# Patient Record
Sex: Female | Born: 1965 | Hispanic: Yes | Marital: Married | Smoking: Former smoker
Health system: Southern US, Community
[De-identification: ages and names within clinical notes are randomized; demographics above are authoritative.]

## PROBLEM LIST (undated history)

## (undated) ENCOUNTER — Emergency Department (HOSPITAL_COMMUNITY): Admission: EM | Payer: BC Managed Care – PPO

## (undated) DIAGNOSIS — D649 Anemia, unspecified: Secondary | ICD-10-CM

## (undated) DIAGNOSIS — E7800 Pure hypercholesterolemia, unspecified: Secondary | ICD-10-CM

## (undated) HISTORY — DX: Pure hypercholesterolemia, unspecified: E78.00

## (undated) HISTORY — DX: Anemia, unspecified: D64.9

---

## 2020-01-09 ENCOUNTER — Other Ambulatory Visit: Payer: Self-pay

## 2020-01-09 DIAGNOSIS — Z20822 Contact with and (suspected) exposure to covid-19: Secondary | ICD-10-CM

## 2020-01-10 LAB — NOVEL CORONAVIRUS, NAA: SARS-CoV-2, NAA: NOT DETECTED

## 2020-03-02 ENCOUNTER — Encounter: Payer: Self-pay | Admitting: Family Medicine

## 2020-03-02 ENCOUNTER — Other Ambulatory Visit: Payer: Self-pay

## 2020-03-02 ENCOUNTER — Ambulatory Visit (INDEPENDENT_AMBULATORY_CARE_PROVIDER_SITE_OTHER): Payer: BC Managed Care – PPO | Admitting: Family Medicine

## 2020-03-02 VITALS — BP 112/78 | HR 76 | Ht 66.0 in | Wt 201.0 lb

## 2020-03-02 DIAGNOSIS — Z7689 Persons encountering health services in other specified circumstances: Secondary | ICD-10-CM | POA: Diagnosis not present

## 2020-03-02 DIAGNOSIS — Z01419 Encounter for gynecological examination (general) (routine) without abnormal findings: Secondary | ICD-10-CM | POA: Diagnosis not present

## 2020-03-02 NOTE — Addendum Note (Signed)
Addended by: Duanne Limerick on: 03/02/2020 04:48 PM   Modules accepted: Level of Service

## 2020-03-02 NOTE — Progress Notes (Signed)
Date:  03/02/2020   Name:  Caitlin Cross   DOB:  [birth year]   MRN:  [id number]   Chief Complaint: Establish Care (needs pcp) and referral gyn  Patient is a 54 year old female who presents for a establish care exam. The patient reports the following problems: none. Health maintenance has been reviewed gyn for pap.   No results found for: CREATININE, BUN, NA, K, CL, CO2 No results found for: CHOL, HDL, LDLCALC, LDLDIRECT, TRIG, CHOLHDL No results found for: TSH No results found for: HGBA1C No results found for: WBC, HGB, HCT, MCV, PLT No results found for: ALT, AST, GGT, ALKPHOS, BILITOT   Review of Systems  Constitutional: Negative.  Negative for chills, fatigue, fever and unexpected weight change.  HENT: Negative for congestion, ear discharge, ear pain, rhinorrhea, sinus pressure, sneezing and sore throat.   Eyes: Negative for photophobia, pain, discharge, redness and itching.  Respiratory: Negative for cough, shortness of breath, wheezing and stridor.   Gastrointestinal: Negative for abdominal pain, blood in stool, constipation, diarrhea, nausea and vomiting.  Endocrine: Negative for cold intolerance, heat intolerance, polydipsia, polyphagia and polyuria.  Genitourinary: Negative for dysuria, flank pain, frequency, hematuria, menstrual problem, pelvic pain, urgency, vaginal bleeding and vaginal discharge.  Musculoskeletal: Negative for arthralgias, back pain and myalgias.  Skin: Negative for rash.  Allergic/Immunologic: Negative for environmental allergies and food allergies.  Neurological: Negative for dizziness, weakness, light-headedness, numbness and headaches.  Hematological: Negative for adenopathy. Does not bruise/bleed easily.  Psychiatric/Behavioral: Negative for dysphoric mood. The patient is not nervous/anxious.     There are no problems to display for this patient.   No Known Allergies  Past Surgical History:  Procedure Laterality Date  . CESAREAN SECTION       x 2    Social History   Tobacco Use  . Smoking status: Former Games developer  . Smokeless tobacco: Never Used  Substance Use Topics  . Alcohol use: Yes    Comment: social  . Drug use: Never     Medication list has been reviewed and updated.  No outpatient medications have been marked as taking for the 03/02/20 encounter (Office Visit) with Duanne Limerick, MD.    [facility] 2/9 Scores 03/02/2020  PHQ - 2 Score 0  PHQ- 9 Score 0    BP Readings from Last 3 Encounters:  03/02/20 112/78    Physical Exam Vitals and nursing note reviewed.  Constitutional:      Appearance: She is well-developed.  HENT:     Head: Normocephalic.     Right Ear: Tympanic membrane, ear canal and external ear normal. There is no impacted cerumen.     Left Ear: Tympanic membrane, ear canal and external ear normal. There is no impacted cerumen.     Nose: Nose normal.  Eyes:     General: Lids are everted, no foreign bodies appreciated. No scleral icterus.       Left eye: No foreign body or hordeolum.     Conjunctiva/sclera: Conjunctivae normal.     Right eye: Right conjunctiva is not injected.     Left eye: Left conjunctiva is not injected.     Pupils: Pupils are equal, round, and reactive to light.  Neck:     Thyroid: No thyromegaly.     Vascular: No JVD.     Trachea: No tracheal deviation.  Cardiovascular:     Rate and Rhythm: Normal rate and regular rhythm.     Heart sounds: Normal heart sounds. No  murmur. No friction rub. No gallop.   Pulmonary:     Effort: Pulmonary effort is normal. No respiratory distress.     Breath sounds: Normal breath sounds. No wheezing or rales.  Abdominal:     General: Bowel sounds are normal.     Palpations: Abdomen is soft. There is no mass.     Tenderness: There is no abdominal tenderness. There is no guarding or rebound.  Musculoskeletal:        General: No tenderness. Normal range of motion.     Cervical back: Normal range of motion and neck supple.   Lymphadenopathy:     Cervical: No cervical adenopathy.  Skin:    General: Skin is warm.     Findings: No rash.  Neurological:     Mental Status: She is alert and oriented to person, place, and time.     Cranial Nerves: No cranial nerve deficit.     Deep Tendon Reflexes: Reflexes normal.  Psychiatric:        Mood and Affect: Mood is not anxious or depressed.     Wt Readings from Last 3 Encounters:  03/02/20 201 lb (91.2 kg)    BP 112/78   Pulse 76   Ht 5\' 6"  (1.676 m)   Wt 201 lb (91.2 kg)   BMI 32.44 kg/m   Assessment and Plan:  1. Establishing care with new doctor, encounter for [facility] establishing care with new physician.  Patient would like to have her lipid panel and glucose checked as well as her GFR noted as well.  We will check a lipid panel in renal function panel. - Lipid Panel With LDL/HDL Ratio - Renal Function Panel  2. Visit for gynecologic examination Will refer for gynecology for establishing care with gynecology and cervical cancer Pap smears. - Ambulatory referral to Gynecology

## 2020-03-03 ENCOUNTER — Telehealth: Payer: Self-pay | Admitting: Obstetrics & Gynecology

## 2020-03-03 LAB — LIPID PANEL WITH LDL/HDL RATIO
Cholesterol, Total: 244 mg/dL — ABNORMAL HIGH (ref 100–199)
HDL: 53 mg/dL (ref >39)
LDL Chol Calc (NIH): 164 mg/dL — ABNORMAL HIGH (ref 0–99)
LDL/HDL Ratio: 3.1 ratio (ref 0.0–3.2)
Triglycerides: 148 mg/dL (ref 0–149)
VLDL Cholesterol Cal: 27 mg/dL (ref 5–40)

## 2020-03-03 LAB — RENAL FUNCTION PANEL
Albumin: 4.6 g/dL (ref 3.8–4.9)
BUN/Creatinine Ratio: 21 (ref 9–23)
BUN: 14 mg/dL (ref 6–24)
CO2: 28 mmol/L (ref 20–29)
Calcium: 10.2 mg/dL (ref 8.7–10.2)
Chloride: 102 mmol/L (ref 96–106)
Creatinine, Ser: 0.66 mg/dL (ref 0.57–1.00)
GFR calc Af Amer: 116 mL/min/{1.73_m2} (ref >59)
GFR calc non Af Amer: 100 mL/min/{1.73_m2} (ref >59)
Glucose: 88 mg/dL (ref 65–99)
Phosphorus: 3.9 mg/dL (ref 3.0–4.3)
Potassium: 4.8 mmol/L (ref 3.5–5.2)
Sodium: 142 mmol/L (ref 134–144)

## 2020-03-03 NOTE — Telephone Encounter (Signed)
[facility] referring for Visit for gynecologic examination. Called and left voicemail for patient to call back to be scheduled.

## 2020-03-06 ENCOUNTER — Other Ambulatory Visit: Payer: Self-pay | Admitting: Internal Medicine

## 2020-03-22 ENCOUNTER — Ambulatory Visit (INDEPENDENT_AMBULATORY_CARE_PROVIDER_SITE_OTHER): Payer: BC Managed Care – PPO | Admitting: Advanced Practice Midwife

## 2020-03-22 ENCOUNTER — Encounter: Payer: Self-pay | Admitting: Advanced Practice Midwife

## 2020-03-22 ENCOUNTER — Other Ambulatory Visit (HOSPITAL_COMMUNITY)
Admission: RE | Admit: 2020-03-22 | Discharge: 2020-03-22 | Disposition: A | Discharge status: Home / Self Care | Payer: BC Managed Care – PPO | Source: Ambulatory Visit | Attending: Advanced Practice Midwife | Admitting: Advanced Practice Midwife

## 2020-03-22 ENCOUNTER — Other Ambulatory Visit: Payer: Self-pay

## 2020-03-22 VITALS — BP 127/66 | Ht 66.0 in | Wt 201.0 lb

## 2020-03-22 DIAGNOSIS — Z113 Encounter for screening for infections with a predominantly sexual mode of transmission: Secondary | ICD-10-CM

## 2020-03-22 DIAGNOSIS — Z124 Encounter for screening for malignant neoplasm of cervix: Secondary | ICD-10-CM

## 2020-03-22 DIAGNOSIS — Z01419 Encounter for gynecological examination (general) (routine) without abnormal findings: Secondary | ICD-10-CM | POA: Insufficient documentation

## 2020-03-22 LAB — HM PAP SMEAR: HM Pap smear: NEGATIVE

## 2020-03-22 NOTE — Patient Instructions (Signed)
Mantenimiento de la salud en las mujeres Health Maintenance, Female Adoptar un estilo de vida saludable y recibir atencin preventiva son importantes para promover la salud y el bienestar. Consulte al mdico sobre:  El esquema adecuado para hacerse pruebas y exmenes peridicos.  Cosas que puede hacer por su cuenta para prevenir enfermedades y mantenerse sana. Qu debo saber sobre la dieta, el peso y el ejercicio? Consuma una dieta saludable   Consuma una dieta que incluya muchas verduras, frutas, productos lcteos con bajo contenido de grasa y protenas magras.  No consuma muchos alimentos ricos en grasas slidas, azcares agregados o sodio. Mantenga un peso saludable El ndice de masa muscular (IMC) se utiliza para identificar problemas de peso. Proporciona una estimacin de la grasa corporal basndose en el peso y la altura. Su mdico puede ayudarle a determinar su IMC y a lograr o mantener un peso saludable. Haga ejercicio con regularidad Haga ejercicio con regularidad. Esta es una de las prcticas ms importantes que puede hacer por su salud. La mayora de los adultos deben seguir estas pautas:  Realizar, al menos, 150minutos de actividad fsica por semana. El ejercicio debe aumentar la frecuencia cardaca y hacerlo transpirar (ejercicio de intensidad moderada).  Hacer ejercicios de fortalecimiento por lo menos dos veces por semana. Agregue esto a su plan de ejercicio de intensidad moderada.  Pasar menos tiempo sentados. Incluso la actividad fsica ligera puede ser beneficiosa. Controle sus niveles de colesterol y lpidos en la sangre Comience a realizarse anlisis de lpidos y colesterol en la sangre a los 20aos y luego reptalos cada 5aos. Hgase controlar los niveles de colesterol con mayor frecuencia si:  Sus niveles de lpidos y colesterol son altos.  Es mayor de 40aos.  Presenta un alto riesgo de padecer enfermedades cardacas. Qu debo saber sobre las pruebas de  deteccin del cncer? Segn su historia clnica y sus antecedentes familiares, es posible que deba realizarse pruebas de deteccin del cncer en diferentes edades. Esto puede incluir pruebas de deteccin de lo siguiente:  Cncer de mama.  Cncer de cuello uterino.  Cncer colorrectal.  Cncer de piel.  Cncer de pulmn. Qu debo saber sobre la enfermedad cardaca, la diabetes y la hipertensin arterial? Presin arterial y enfermedad cardaca  La hipertensin arterial causa enfermedades cardacas y aumenta el riesgo de accidente cerebrovascular. Es ms probable que esto se manifieste en las personas que tienen lecturas de presin arterial alta, tienen ascendencia africana o tienen sobrepeso.  Hgase controlar la presin arterial: ? Cada 3 a 5 aos si tiene entre 18 y 39 aos. ? Todos los aos si es mayor de 40aos. Diabetes Realcese exmenes de deteccin de la diabetes con regularidad. Este anlisis revisa el nivel de azcar en la sangre en ayunas. Hgase las pruebas de deteccin:  Cada tresaos despus de los 40aos de edad si tiene un peso normal y un bajo riesgo de padecer diabetes.  Con ms frecuencia y a partir de una edad inferior si tiene sobrepeso o un alto riesgo de padecer diabetes. Qu debo saber sobre la prevencin de infecciones? Hepatitis B Si tiene un riesgo ms alto de contraer hepatitis B, debe someterse a un examen de deteccin de este virus. Hable con el mdico para averiguar si tiene riesgo de contraer la infeccin por hepatitis B. Hepatitis C Se recomienda el anlisis a:  Todos los que nacieron entre 1945 y 1965.  Todas las personas que tengan un riesgo de haber contrado hepatitis C. Enfermedades de transmisin sexual (ETS)  Hgase las   pruebas de deteccin de ITS, incluidas la gonorrea y la clamidia, si: ? Es sexualmente activa y es menor de 24aos. ? Es mayor de 24aos, y el mdico le informa que corre riesgo de tener este tipo de  infecciones. ? La actividad sexual ha cambiado desde que le hicieron la ltima prueba de deteccin y tiene un riesgo mayor de tener clamidia o gonorrea. Pregntele al mdico si usted tiene riesgo.  Pregntele al mdico si usted tiene un alto riesgo de contraer VIH. El mdico tambin puede recomendarle un medicamento recetado para ayudar a evitar la infeccin por el VIH. Si elige tomar medicamentos para prevenir el VIH, primero debe hacerse los anlisis de deteccin del VIH. Luego debe hacerse anlisis cada 3meses mientras est tomando los medicamentos. Embarazo  Si est por dejar de menstruar (fase premenopusica) y usted puede quedar embarazada, busque asesoramiento antes de quedar embarazada.  Tome de 400 a 800microgramos (mcg) de cido flico todos los das si queda embarazada.  Pida mtodos de control de la natalidad (anticonceptivos) si desea evitar un embarazo no deseado. Osteoporosis y menopausia La osteoporosis es una enfermedad en la que los huesos pierden los minerales y la fuerza por el avance de la edad. El resultado pueden ser fracturas en los huesos. Si tiene 65aos o ms, o si est en riesgo de sufrir osteoporosis y fracturas, pregunte a su mdico si debe:  Hacerse pruebas de deteccin de prdida sea.  Tomar un suplemento de calcio o de vitamina D para reducir el riesgo de fracturas.  Recibir terapia de reemplazo hormonal (TRH) para tratar los sntomas de la menopausia. Siga estas instrucciones en su casa: Estilo de vida  No consuma ningn producto que contenga nicotina o tabaco, como cigarrillos, cigarrillos electrnicos y tabaco de mascar. Si necesita ayuda para dejar de fumar, consulte al mdico.  No consuma drogas.  No comparta agujas.  Solicite ayuda a su mdico si necesita apoyo o informacin para abandonar las drogas. Consumo de alcohol  No beba alcohol si: ? Su mdico le indica no hacerlo. ? Est embarazada, puede estar embarazada o est tratando de quedar  embarazada.  Si bebe alcohol: ? Limite la cantidad que consume de 0 a 1 medida por da. ? Limite la ingesta si est amamantando.  Est atento a la cantidad de alcohol que hay en las bebidas que toma. En los [place], una medida equivale a una botella de cerveza de 12oz (355ml), un vaso de vino de 5oz (148ml) o un vaso de una bebida alcohlica de alta graduacin de 1oz (44ml). Instrucciones generales  Realcese los estudios de rutina de la salud, dentales y de la vista.  Mantngase al da con las vacunas.  Infrmele a su mdico si: ? Se siente deprimida con frecuencia. ? Alguna vez ha sido vctima de maltrato o no se siente segura en su casa. Resumen  Adoptar un estilo de vida saludable y recibir atencin preventiva son importantes para promover la salud y el bienestar.  Siga las instrucciones del mdico acerca de una dieta saludable, el ejercicio y la realizacin de pruebas o exmenes para detectar enfermedades.  Siga las instrucciones del mdico con respecto al control del colesterol y la presin arterial. Esta informacin no tiene como fin reemplazar el consejo del mdico. Asegrese de hacerle al mdico cualquier pregunta que tenga. Document Revised: 11/18/2018 Document Reviewed: 11/18/2018 Elsevier Patient Education  2020 Elsevier Inc.  

## 2020-03-22 NOTE — Progress Notes (Signed)
Gynecology Annual Exam  Date of Service: 03/22/2020  Interpreter present assisting with interview  The patient was referred by her new PCP for PAP smear. She was 10 minutes late for her appointment and was focused on post-menopausal symptoms. Due to length of time spent on patient's concerns, we were unable to address mammogram or colonoscopy.    PCP: Duanne Limerick, MD  Chief Complaint:  Chief Complaint  Patient presents with  . Gynecologic Exam    Vasomotor symptoms    History of Present Illness:Patient is a 54 y.o. G4P4 presents for annual exam. The patient has post-menopausal complaints today. She has a hot flash during the night about 3-4 times per week which is mildly disruptive due to the sweating associated with it. She denies interrupted sleep. She wonders what non-hormonal treatments are available. She also mentions that she would like to have more energy during the day and wonders what she can take for that. We discussed healthy lifestyle, caffeine in moderate doses as a relatively safe stimulant, and alternative treatments for vaso-constrictive events. She would like to start with non-pharmaceutical options.   LMP: No LMP recorded. Patient is post-menopausal  Postcoital Bleeding: no Dysmenorrhea: not applicable  The patient is sexually active. She denies dyspareunia.  The patient does perform self breast exams.  There is no notable family history of breast or ovarian cancer in her family.  The patient wears seatbelts: yes.   The patient has regular exercise: she walks several days per week. she admits a generally healthy diet and adequate hydration and adequate sleep.    The patient denies current symptoms of depression.     Review of Systems: Review of Systems  Constitutional: Negative for chills and fever.       Positive for decreased energy  HENT: Negative for congestion, ear discharge, ear pain, hearing loss, sinus pain and sore throat.   Eyes: Negative for  blurred vision and double vision.  Respiratory: Negative for cough, shortness of breath and wheezing.   Cardiovascular: Negative for chest pain, palpitations and leg swelling.  Gastrointestinal: Negative for abdominal pain, blood in stool, constipation, diarrhea, heartburn, melena, nausea and vomiting.  Genitourinary: Negative for dysuria, flank pain, frequency, hematuria and urgency.  Musculoskeletal: Negative for back pain, joint pain and myalgias.  Skin: Negative for itching and rash.  Neurological: Negative for dizziness, tingling, tremors, sensory change, speech change, focal weakness, seizures, loss of consciousness, weakness and headaches.  Endo/Heme/Allergies: Negative for environmental allergies. Does not bruise/bleed easily.       Positive for hot flashes  Psychiatric/Behavioral: Negative for depression, hallucinations, memory loss, substance abuse and suicidal ideas. The patient is not nervous/anxious and does not have insomnia.     Past Medical History:  There are no problems to display for this patient.   Past Surgical History:  Past Surgical History:  Procedure Laterality Date  . CESAREAN SECTION     x 2    Gynecologic History:  No LMP recorded. Last Pap: 2 years ago in [place] Results were:  no abnormalities per patient Last mammogram: unknown Results were: reports calcifications 10 years ago  Obstetric History: G68P4  Family History:  Family History  Problem Relation Age of Onset  . Diabetes Mother   . Heart disease Father   . Stroke Father   . Diabetes Sister   . Diabetes Brother   . Diabetes Maternal Grandmother     Social History:  Social History   Socioeconomic History  . Marital  status: Married    Spouse name: Not on file  . Number of children: Not on file  . Years of education: Not on file  . Highest education level: Not on file  Occupational History  . Not on file  Tobacco Use  . Smoking status: Former Research scientist (life sciences)  . Smokeless tobacco: Never  Used  Substance and Sexual Activity  . Alcohol use: Yes    Comment: social  . Drug use: Never  . Sexual activity: Yes    Birth control/protection: None  Other Topics Concern  . Not on file  Social History Narrative  . Not on file   Social Determinants of Health   Financial Resource Strain:   . Difficulty of Paying Living Expenses:   Food Insecurity:   . Worried About Charity fundraiser in the Last Year:   . Arboriculturist in the Last Year:   Transportation Needs:   . Film/video editor (Medical):   Marland Kitchen Lack of Transportation (Non-Medical):   Physical Activity:   . Days of Exercise per Week:   . Minutes of Exercise per Session:   Stress:   . Feeling of Stress :   Social Connections:   . Frequency of Communication with Friends and Family:   . Frequency of Social Gatherings with Friends and Family:   . Attends Religious Services:   . Active Member of Clubs or Organizations:   . Attends Archivist Meetings:   Marland Kitchen Marital Status:   Intimate Partner Violence:   . Fear of Current or Ex-Partner:   . Emotionally Abused:   Marland Kitchen Physically Abused:   . Sexually Abused:     Allergies:  No Known Allergies  Medications: Prior to Admission medications   Medication Sig Start Date End Date Taking? Authorizing Provider  Multiple Vitamins-Minerals (WOMENS MULTIVITAMIN PO) Take by mouth.   Yes [provider]  Omega-3 Fatty Acids (FISH OIL) 1000 MG CAPS Take by mouth.   Yes [provider]    Physical Exam Vitals: Blood pressure 127/66, height 5\' 6"  (1.676 m), weight 201 lb (91.2 kg).  General: NAD HEENT: normocephalic, anicteric Thyroid: no enlargement, no palpable nodules Pulmonary: No increased work of breathing, CTAB Cardiovascular: RRR, distal pulses 2+ Breast: Breast symmetrical, no tenderness, no palpable nodules or masses, no skin or nipple retraction present, no nipple discharge.  No axillary or supraclavicular lymphadenopathy. Abdomen: NABS,  soft, non-tender, non-distended.  Umbilicus without lesions.  No hepatomegaly, splenomegaly or masses palpable. No evidence of hernia  Genitourinary:  External: Normal external female genitalia.  Normal urethral meatus, normal Bartholin's and Skene's glands.    Vagina: Normal vaginal mucosa, no evidence of prolapse.    Cervix:  2 Skin tags vs polyps vs unrepaired laceration present approximately 1 cm x 1 cm each, no bleeding, no CMT  Uterus: Non-enlarged, mobile, normal contour.    Adnexa: ovaries non-enlarged, no adnexal masses  Rectal: deferred  Lymphatic: no evidence of inguinal lymphadenopathy Extremities: no edema, erythema, or tenderness Neurologic: Grossly intact Psychiatric: mood appropriate, affect full     Assessment: 54 y.o. G4P4 routine annual exam  Plan: Problem List Items Addressed This Visit    None    Visit Diagnoses    Well woman exam with routine gynecological exam    -  Primary   Relevant Orders   HIV Antibody (routine testing w rflx)   RPR Qual   Hepatitis B surface antigen   Cytology - PAP   Screen for sexually transmitted  diseases       Relevant Orders   HIV Antibody (routine testing w rflx)   RPR Qual   Hepatitis B surface antigen   Cytology - PAP   Cervical cancer screening       Relevant Orders   Cytology - PAP      1) Mammogram - recommend yearly screening mammogram.  Mammogram to be ordered by PCP   2) STI screening for both serum and vaginal swab organisms  was offered and accepted. Patient unable to go to [facility] today for blood draw. Lab requisitions handed to patient so she can go when her schedule permits.  3) ASCCP guidelines and rationale discussed.  Patient opts for every 3 years screening interval. PAP done today as we don't have records of prior PAP. Will follow up with patient as needed for recommendations regarding abnormal appearing cervix.  4) Osteoporosis  - per USPTF routine screening DEXA at age 41  Consider FDA-approved  medical therapies in postmenopausal women and men aged 61 years and older, based on the following: a) A hip or vertebral (clinical or morphometric) fracture b) T-score ? -2.5 at the femoral neck or spine after appropriate evaluation to exclude secondary causes C) Low bone mass (T-score between -1.0 and -2.5 at the femoral neck or spine) and a 10-year probability of a hip fracture ? 3% or a 10-year probability of a major osteoporosis-related fracture ? 20% based on the [place]-adapted WHO algorithm   5) Routine healthcare maintenance including cholesterol, diabetes screening discussed managed by PCP  6) Colonoscopy: to be managed by PCP.  Screening recommended starting at age 24 for average risk individuals, age 7 for individuals deemed at increased risk (including African Americans) and recommended to continue until age 58.  For patient age 71-85 individualized approach is recommended.  Gold standard screening is via colonoscopy, Cologuard screening is an acceptable alternative for patient unwilling or unable to undergo colonoscopy.  "Colorectal cancer screening for average?risk adults: 2018 guideline update from the American Cancer Society"CA: A Cancer Journal for Clinicians: Apr 09, 2017   7) Increase healthy lifestyle: exercise. OTC supplements for post-menopausal hot flashes: Omega 3 FA, Vitamin D3 1000 units daily, black cohosh, phytoestrogens- soy, grains, other beans, fruits, vegetables. Dress in layers. Fan. Stay hydrated.  7) Return in about 1 year (around 03/22/2021) for annual established gyn.    Parke Poisson, CNM [facility] 03/23/20, 11:38 AM

## 2020-03-23 ENCOUNTER — Encounter: Payer: Self-pay | Admitting: Advanced Practice Midwife

## 2020-03-24 LAB — CYTOLOGY - PAP
Chlamydia: NEGATIVE
Comment: NEGATIVE
Comment: NEGATIVE
Comment: NEGATIVE
Comment: NORMAL
Diagnosis: NEGATIVE
High risk HPV: NEGATIVE
Neisseria Gonorrhea: NEGATIVE
Trichomonas: NEGATIVE

## 2020-03-28 ENCOUNTER — Other Ambulatory Visit: Payer: Self-pay

## 2020-03-28 NOTE — Progress Notes (Unsigned)
Entered pap 

## 2020-11-29 ENCOUNTER — Other Ambulatory Visit: Payer: Self-pay

## 2020-11-29 ENCOUNTER — Ambulatory Visit
Admission: EM | Admit: 2020-11-29 | Discharge: 2020-11-29 | Disposition: A | Discharge status: Home / Self Care | Payer: BC Managed Care – PPO | Attending: Emergency Medicine | Admitting: Emergency Medicine

## 2020-11-29 ENCOUNTER — Ambulatory Visit
Admission: EM | Admit: 2020-11-29 | Discharge: 2020-11-29 | Disposition: A | Discharge status: Home / Self Care | Payer: BC Managed Care – PPO

## 2020-11-29 DIAGNOSIS — Z20822 Contact with and (suspected) exposure to covid-19: Secondary | ICD-10-CM | POA: Diagnosis not present

## 2020-11-29 NOTE — ED Notes (Signed)
No answer when called for room 

## 2020-11-29 NOTE — ED Triage Notes (Signed)
Pt presents with exposure to covid, daughter is covid positive. Reports fever, fatigue, and flue like symptoms. Pt denies any other symptoms. Symptoms started yesterday.

## 2020-11-29 NOTE — Discharge Instructions (Addendum)
Late at home into the results of your COVID test come back.  If you are positive then you will need to quarantine for 5 days from when your symptoms started.  After the 5 days you can break quarantine if your symptoms have improved and you have not had a fever for 24 hours.  Use over-the-counter Tylenol and ibuprofen as needed for fever or pain.  If you develop shortness of breath-especially at rest, you were unable to speak full sentences, or as a late sign your lips started turning blue you need to go to the ER for evaluation.

## 2020-11-29 NOTE — ED Provider Notes (Signed)
[facility]    CSN: [id number] Arrival date & time: 11/29/20  1311      History   Chief Complaint Chief Complaint  Patient presents with  . Fever    HPI Caitlin Cross is a 55 y.o. female.   HPI   55 year old female here for evaluation of fever and fatigue that is been going on for the past 2 days.  Patient reports that she was exposed to COVID through her daughter.  Apparently her daughter just had a cesarean section and came home from the hospital today.  Patient states that her maximum temperature was 99 and she has had associated symptoms of sneezing, sore throat, nonproductive cough.  Patient denies runny nose, shortness of breath or wheezing, nausea, or vomiting.  Patient's had both her COVID-vaccine and booster as well as her flu shot.  Past Medical History:  Diagnosis Date  . Anemia     There are no problems to display for this patient.   Past Surgical History:  Procedure Laterality Date  . CESAREAN SECTION     x 2    OB History    Gravida  4   Para  4   Term      Preterm      AB      Living  4     SAB      IAB      Ectopic      Multiple  1   Live Births               Home Medications    Prior to Admission medications   Medication Sig Start Date End Date Taking? Authorizing Provider  Multiple Vitamins-Minerals (WOMENS MULTIVITAMIN PO) Take by mouth.    [provider]  Omega-3 Fatty Acids (FISH OIL) 1000 MG CAPS Take by mouth.    [provider]    Family History Family History  Problem Relation Age of Onset  . Diabetes Mother   . Heart disease Father   . Stroke Father   . Diabetes Sister   . Diabetes Brother   . Diabetes Maternal Grandmother     Social History Social History   Tobacco Use  . Smoking status: Former Games developer  . Smokeless tobacco: Never Used  Vaping Use  . Vaping Use: Never used  Substance Use Topics  . Alcohol use: Yes    Comment: social  . Drug use: Never      Allergies   Patient has no known allergies.   Review of Systems Review of Systems  Constitutional: Positive for diaphoresis and fever. Negative for activity change and appetite change.  HENT: Positive for congestion, sneezing and sore throat. Negative for ear pain and rhinorrhea.   Respiratory: Negative for cough, shortness of breath and wheezing.   Gastrointestinal: Negative for diarrhea, nausea and vomiting.  Musculoskeletal: Negative for arthralgias and myalgias.  Skin: Negative for rash.  Hematological: Negative.      Physical Exam Triage Vital Signs ED Triage Vitals  Enc Vitals Group     BP 11/29/20 1328 118/84     Pulse Rate 11/29/20 1328 72     Resp 11/29/20 1328 19     Temp 11/29/20 1328 98.5 F (36.9 C)     Temp src --      SpO2 --      Weight --      Height --      Head Circumference --      Peak Flow --  Pain Score 11/29/20 1326 0     Pain Loc --      Pain Edu? --      Excl. in GC? --    No data found.  Updated Vital Signs BP 118/84   Pulse 72   Temp 98.5 F (36.9 C)   Resp 19   Visual Acuity Right Eye Distance:   Left Eye Distance:   Bilateral Distance:    Right Eye Near:   Left Eye Near:    Bilateral Near:     Physical Exam Vitals and nursing note reviewed.  Constitutional:      General: She is not in acute distress.    Appearance: Normal appearance. She is not toxic-appearing.  HENT:     Head: Normocephalic and atraumatic.     Right Ear: Tympanic membrane, ear canal and external ear normal.     Left Ear: Tympanic membrane, ear canal and external ear normal.     Nose: Congestion present. No rhinorrhea.     Mouth/Throat:     Mouth: Mucous membranes are moist.     Pharynx: Oropharynx is clear. No oropharyngeal exudate or posterior oropharyngeal erythema.  Cardiovascular:     Rate and Rhythm: Normal rate.     Pulses: Normal pulses.     Heart sounds: No murmur heard. No gallop.   Pulmonary:     Effort: Pulmonary effort is  normal.     Breath sounds: Normal breath sounds. No wheezing, rhonchi or rales.  Musculoskeletal:     Cervical back: Normal range of motion and neck supple.  Lymphadenopathy:     Cervical: No cervical adenopathy.  Skin:    General: Skin is warm and dry.     Capillary Refill: Capillary refill takes less than 2 seconds.     Findings: No erythema or rash.  Neurological:     General: No focal deficit present.     Mental Status: She is alert and oriented to person, place, and time.  Psychiatric:        Mood and Affect: Mood normal.        Behavior: Behavior normal.        Thought Content: Thought content normal.        Judgment: Judgment normal.      UC Treatments / Results  Labs (all labs ordered are listed, but only abnormal results are displayed) Labs Reviewed  SARS CORONAVIRUS 2 (TAT 6-24 HRS)    EKG   Radiology No results found.  Procedures Procedures (including critical care time)  Medications Ordered in UC Medications - No data to display  Initial Impression / Assessment and Plan / UC Course  I have reviewed the triage vital signs and the nursing notes.  Pertinent labs & imaging results that were available during my care of the patient were reviewed by me and considered in my medical decision making (see chart for details).   Patient is here for evaluation of possible COVID secondary to exposure from her daughter who was just released from the hospital after cesarean section.  Patient's physical exam only reveals some nasal congestion without nasal discharge.  The remainder of her exam is benign.  Will swab patient for COVID and have her isolate at home pending the results.  If she is positive then she will have to quarantine for 5 days from the start of her symptoms. Final Clinical Impressions(s) / UC Diagnoses   Final diagnoses:  Exposure to confirmed case of COVID-19     Discharge  Instructions     Late at home into the results of your COVID test come back.   If you are positive then you will need to quarantine for 5 days from when your symptoms started.  After the 5 days you can break quarantine if your symptoms have improved and you have not had a fever for 24 hours.  Use over-the-counter Tylenol and ibuprofen as needed for fever or pain.  If you develop shortness of breath-especially at rest, you were unable to speak full sentences, or as a late sign your lips started turning blue you need to go to the ER for evaluation.    ED Prescriptions    None     PDMP not reviewed this encounter.   Becky Augusta, NP 11/29/20 1441

## 2020-11-30 LAB — SARS CORONAVIRUS 2 (TAT 6-24 HRS): SARS Coronavirus 2: NEGATIVE

## 2020-12-04 ENCOUNTER — Other Ambulatory Visit: Payer: BC Managed Care – PPO

## 2020-12-06 ENCOUNTER — Other Ambulatory Visit: Payer: BC Managed Care – PPO

## 2020-12-06 DIAGNOSIS — Z20822 Contact with and (suspected) exposure to covid-19: Secondary | ICD-10-CM

## 2020-12-08 LAB — NOVEL CORONAVIRUS, NAA: SARS-CoV-2, NAA: NOT DETECTED

## 2020-12-08 LAB — SARS-COV-2, NAA 2 DAY TAT

## 2021-01-07 ENCOUNTER — Emergency Department: Payer: BC Managed Care – PPO

## 2021-01-07 ENCOUNTER — Other Ambulatory Visit: Payer: Self-pay

## 2021-01-07 ENCOUNTER — Emergency Department
Admission: EM | Admit: 2021-01-07 | Discharge: 2021-01-07 | Disposition: A | Discharge status: Home / Self Care | Payer: BC Managed Care – PPO | Attending: Emergency Medicine | Admitting: Emergency Medicine

## 2021-01-07 DIAGNOSIS — Z87891 Personal history of nicotine dependence: Secondary | ICD-10-CM | POA: Diagnosis not present

## 2021-01-07 DIAGNOSIS — Y999 Unspecified external cause status: Secondary | ICD-10-CM | POA: Insufficient documentation

## 2021-01-07 DIAGNOSIS — M542 Cervicalgia: Secondary | ICD-10-CM

## 2021-01-07 DIAGNOSIS — Y92410 Unspecified street and highway as the place of occurrence of the external cause: Secondary | ICD-10-CM | POA: Insufficient documentation

## 2021-01-07 DIAGNOSIS — Y9389 Activity, other specified: Secondary | ICD-10-CM | POA: Diagnosis not present

## 2021-01-07 DIAGNOSIS — M545 Low back pain, unspecified: Secondary | ICD-10-CM | POA: Diagnosis not present

## 2021-01-07 DIAGNOSIS — H9313 Tinnitus, bilateral: Secondary | ICD-10-CM | POA: Diagnosis not present

## 2021-01-07 MED ORDER — MELOXICAM 7.5 MG PO TABS
15.0000 mg | ORAL_TABLET | Freq: Once | ORAL | Status: AC
  Administered 2021-01-07: 15 mg via ORAL
  Filled 2021-01-07: qty 2

## 2021-01-07 MED ORDER — ACETAMINOPHEN 325 MG PO TABS
650.0000 mg | ORAL_TABLET | Freq: Once | ORAL | Status: AC
  Administered 2021-01-07: 650 mg via ORAL
  Filled 2021-01-07: qty 2

## 2021-01-07 MED ORDER — METHOCARBAMOL 500 MG PO TABS
750.0000 mg | ORAL_TABLET | Freq: Once | ORAL | Status: AC
  Administered 2021-01-07: 750 mg via ORAL
  Filled 2021-01-07: qty 2

## 2021-01-07 MED ORDER — MELOXICAM 15 MG PO TABS: 15.0000 mg | ORAL_TABLET | Freq: Every day | ORAL | 0 refills | Status: AC

## 2021-01-07 MED ORDER — METHOCARBAMOL 750 MG PO TABS: 750.0000 mg | ORAL_TABLET | Freq: Four times a day (QID) | ORAL | 0 refills | Status: AC | PRN
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
## 2021-01-07 NOTE — Discharge Instructions (Addendum)
Take anti-inflammatory and muscle relaxant as prescribed.  Follow-up with primary care if not improved in 1 to 2 weeks.
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
## 2021-01-07 NOTE — ED Notes (Signed)
Patient back to room from imaging.

## 2021-04-13 ENCOUNTER — Telehealth: Payer: Self-pay

## 2021-04-13 NOTE — Telephone Encounter (Signed)
Pt scheduled for cpe

## 2021-04-13 NOTE — Telephone Encounter (Signed)
We have not seen this patient since 2021. Please call the patient to schedule a CPE with Dr Yetta Barre. We need to update her health maintenance.   Thank you!

## 2021-04-18 ENCOUNTER — Ambulatory Visit
Admission: RE | Admit: 2021-04-18 | Discharge: 2021-04-18 | Disposition: A | Discharge status: Home / Self Care | Payer: BC Managed Care – PPO | Attending: Family Medicine | Admitting: Family Medicine

## 2021-04-18 ENCOUNTER — Ambulatory Visit (INDEPENDENT_AMBULATORY_CARE_PROVIDER_SITE_OTHER): Payer: BC Managed Care – PPO | Admitting: Family Medicine

## 2021-04-18 ENCOUNTER — Encounter: Payer: Self-pay | Admitting: Family Medicine

## 2021-04-18 ENCOUNTER — Ambulatory Visit
Admission: RE | Admit: 2021-04-18 | Discharge: 2021-04-18 | Disposition: A | Discharge status: Home / Self Care | Payer: BC Managed Care – PPO | Source: Ambulatory Visit | Attending: Family Medicine | Admitting: Family Medicine

## 2021-04-18 ENCOUNTER — Other Ambulatory Visit: Payer: Self-pay

## 2021-04-18 VITALS — BP 122/62 | HR 64 | Ht 66.0 in | Wt 205.0 lb

## 2021-04-18 DIAGNOSIS — M25562 Pain in left knee: Secondary | ICD-10-CM | POA: Diagnosis not present

## 2021-04-18 DIAGNOSIS — G8929 Other chronic pain: Secondary | ICD-10-CM

## 2021-04-18 DIAGNOSIS — Z1211 Encounter for screening for malignant neoplasm of colon: Secondary | ICD-10-CM | POA: Diagnosis not present

## 2021-04-18 DIAGNOSIS — Z0000 Encounter for general adult medical examination without abnormal findings: Secondary | ICD-10-CM

## 2021-04-18 DIAGNOSIS — Z6833 Body mass index (BMI) 33.0-33.9, adult: Secondary | ICD-10-CM | POA: Diagnosis not present

## 2021-04-18 MED ORDER — MELOXICAM 15 MG PO TABS: 15.0000 mg | ORAL_TABLET | Freq: Every day | ORAL | 0 refills | Status: DC

## 2021-04-18 NOTE — Patient Instructions (Signed)
Recuento de calorías para bajar de peso  Calorie Counting for Weight Loss  Las calorías son unidades de energía. El cuerpo necesita una cierta cantidad de calorías de los alimentos para que lo ayuden a funcionar durante todo el día. Cuando se comen o beben más calorías de las que el cuerpo necesita, este acumula las calorías adicionales mayormente como grasa. Cuando se comen o beben menos calorías de las que el cuerpo necesita, este quema grasa para obtener la energía que necesita.  El recuento de calorías es el registro de la cantidad de calorías que se comen y beben cada día. El recuento de calorías puede ser de ayuda si necesita perder peso. Si come menos calorías de las que el cuerpo necesita, debería bajar de peso. Pregúntele al médico cuál es un peso sano para usted.  Para que el recuento de calorías funcione, usted tendrá que ingerir la cantidad de calorías adecuadas cada día, para bajar una cantidad de peso saludable por semana. Un nutricionista puede ayudar a determinar la cantidad de calorías que usted necesita por día y sugerirle formas de alcanzar su objetivo calórico.  · Una cantidad de peso saludable para bajar cada semana suele ser entre 1 y 2 libras (0.5 a 0.9 kg). Esto habitualmente significa que su ingesta diaria de calorías se debería reducir en unas 500 a 750 calorías.  · Ingerir de 1200 a 1500 calorías por día puede ayudar a la mayoría de las mujeres a bajar de peso.  · Ingerir de 1500 a 1800 calorías por día puede ayudar a la mayoría de los hombres a bajar de peso.  ¿Qué debo saber acerca del recuento de calorías?  Trabaje con el médico o el nutricionista para determinar cuántas calorías debe recibir cada día. A fin de alcanzar su objetivo diario de calorías, tendrá que:  · Averiguar cuántas calorías hay en cada alimento que le gustaría comer. Intente hacerlo antes de comer.  · Decidir la cantidad que puede comer del alimento.  · Llevar un registro de los alimentos. Para esto, anote lo que  comió y cuántas calorías tenía.  Para perder peso con éxito, es importante equilibrar el recuento de calorías con un estilo de vida saludable que incluya actividad física de forma regular.  ¿Dónde encuentro información sobre las calorías?    Es posible encontrar la cantidad de calorías que contiene un alimento en la etiqueta de información nutricional. Si un alimento no tiene una etiqueta de información nutricional, intente buscar las calorías en Internet o pida ayuda al nutricionista.  Recuerde que las calorías se calculan por porción. Si opta por comer más de una porción de un alimento, tendrá que multiplicar las calorías de una porción por la cantidad de porciones que planea comer. Por ejemplo, la etiqueta de un envase de pan puede decir que el tamaño de una porción es 1 rodaja, y que una porción tiene 90 calorías. Si come 1 rodaja, habrá comido 90 calorías. Si come 2 rodajas, habrá comido 180 calorías.  ¿Cómo llevo un registro de comidas?  Después de cada vez que coma, anote lo siguiente en el registro de alimentos lo antes posible:  · Lo que comió. Asegúrese de incluir los aderezos, las salsas y otros extras en los alimentos.  · La cantidad que comió. Esto se puede medir en tazas, onzas o cantidad de alimentos.  · Cuántas calorías había en cada alimento y en cada bebida.  · La cantidad total de calorías en la comida que tomó.  Tenga a mano el registro de alimentos, por ejemplo, en un anotador de bolsillo o   utilice una aplicación o sitio web en el teléfono móvil. Algunos programas calcularán las calorías por usted y le mostrarán la cantidad de calorías que le quedan para llegar al objetivo diario.  ¿Cuáles son algunos consejos para controlar las porciones?  · Sepa cuántas calorías hay en una porción. Esto lo ayudará a saber cuántas porciones de un alimento determinado puede comer.  · Use una taza medidora para medir los tamaños de las porciones. También puede intentar pesar las porciones en una balanza de  cocina. Con el tiempo, podrá hacer un cálculo estimativo de los tamaños de las porciones de algunos alimentos.  · Dedique tiempo a poner porciones de diferentes alimentos en sus platos, tazones y tazas predilectos, a fin de saber cómo se ve una porción.  · Intente no comer directamente de un envase de alimentos, por ejemplo, de una bolsa o una caja. Comer directamente del envase dificulta ver cuánto está comiendo y puede conducir a comer en exceso. Ponga la cantidad que le gustaría comer en una taza o un plato, a fin de asegurarse de que está comiendo la porción correcta.  · Use platos, vasos y tazones más pequeños para medir porciones más pequeñas y evitar no comer en exceso.  · Intente no realizar varias tareas al mismo tiempo. Por ejemplo, evite mirar televisión o usar la computadora mientras come. Si es la hora de comer, siéntese a la mesa y disfrute de la comida. Esto lo ayudará a reconocer cuándo está satisfecho. También le permitirá estar más consciente de qué come y cuánto come.  Consejos para seguir este plan  Al leer las etiquetas de los alimentos  · Controle el recuento de calorías en comparación con el tamaño de la porción. El tamaño de la porción puede ser más pequeño de lo que suele comer.  · Verifique la fuente de las calorías. Intente elegir alimentos ricos en proteínas, fibras y vitaminas, y bajos en grasas saturadas, grasas trans y sodio.  Al ir de compras  · Lea las etiquetas nutricionales cuando compre. Esto lo ayudará a tomar decisiones saludables sobre qué alimentos comprar.  · Preste atención a las etiquetas nutricionales de alimentos bajos en grasas o sin grasas. Estos alimentos a veces tienen la misma cantidad de calorías o más calorías que las versiones ricas en grasas. Con frecuencia, también tienen agregados de azúcar, almidón o sal, para darles el sabor que fue eliminado con las grasas.  · Haga una lista de compras con los alimentos que tienen un menor contenido de calorías y  respétela.  Al cocinar  · Intente cocinar sus alimentos preferidos de una manera más saludable. Por ejemplo, pruebe hornear en vez de freír.  · Utilice productos lácteos descremados.  Planificación de las comidas  · Utilice más frutas y verduras. La mitad de su plato debe ser de frutas y verduras.  · Incluya proteínas magras, como pollo, pavo y pescado.  Estilo de vida  Cada semana, trate de hacer una de las siguientes cosas:  · 150 minutos de ejercicio moderado, como caminar.  · 75 minutos de ejercicio enérgico, como correr.  Información general  · Sepa cuántas calorías tienen los alimentos que come con más frecuencia. Esto le ayudará a contar las calorías más rápidamente.  · Encuentre un método para controlar las calorías que funcione para usted. Sea creativo. Pruebe aplicaciones o programas distintos, si llevar un registro de las calorías no funciona para usted.  ¿Qué alimentos debo consumir?    · Consuma alimentos nutritivos. Es mejor comer un alimento   nutritivo, de alto contenido calórico, como un aguacate, que uno con pocos nutrientes, como una bolsa de patatas fritas.  · Use sus calorías en alimentos y bebidas que lo sacien y no lo dejen con apetito apenas termina de comer.  ? Ejemplos de alimentos que lo sacian son los frutos secos y mantequillas de frutos secos, verduras, proteínas magras y alimentos con alto contenido de fibra como los cereales integrales. Los alimentos con alto contenido de fibra son aquellos que tienen más de 5 g de fibra por porción.  · Preste atención a las calorías en las bebidas. Las bebidas de bajas calorías incluyen agua y refrescos sin azúcar.  Es posible que los productos que se enumeran más arriba no constituyan una lista completa de los alimentos y las bebidas que puede tomar. Consulte a un nutricionista para obtener más información.  ¿Qué alimentos debo limitar?  Limite el consumo de alimentos o bebidas que no sean buenas fuentes de vitaminas, minerales o proteínas, o que  tengan alto contenido de grasas no saludables. Estos incluyen:  · Caramelos.  · Otros dulces.  · Refrescos, bebidas con café especiales, alcohol y jugo.  Es posible que los productos que se enumeran más arriba no constituyan una lista completa de los alimentos y las bebidas que debe evitar. Consulte a un nutricionista para obtener más información.  ¿Cómo puedo hacer el recuento de calorías cuando como afuera?  · Preste atención a las porciones. A menudo, las porciones son mucho más grandes al comer afuera. Pruebe con estos consejos para mantener las porciones más pequeñas:  ? Considere la posibilidad de compartir una comida en lugar de tomarla toda usted solo.  ? Si pide su propia comida, coma solo la mitad. Antes de empezar a comer, pida un recipiente y ponga la mitad de la comida en él.  ? Cuando sea posible, considere la posibilidad de pedir porciones más pequeñas del menú en lugar de porciones completas.  · Preste atención a la elección de alimentos y bebidas. Saber la forma en que se cocinan los alimentos y lo que incluye la comida puede ayudarlo a ingerir menos calorías.  ? Si se detallan las calorías en el menú, elija las opciones que contengan la menor cantidad.  ? Elija platos que incluyan verduras, frutas, cereales integrales, productos lácteos con bajo contenido de grasa y proteínas magras.  ? Opte por los alimentos hervidos, asados, cocidos a la parrilla o al vapor. Evite los alimentos a los que se les ponga mantequilla, que estén empanados o fritos, o que se sirvan con salsa a base de crema. Generalmente, los alimentos que se etiquetan como “crujientes” están fritos, a menos que se indique lo contrario.  ? Elija el agua, la leche descremada, el té helado sin azúcar u otras bebidas que no contengan azúcares agregados. Si desea una bebida alcohólica, escoja una opción con menos calorías, como una copa de vino o una cerveza ligera.  ? Ordene los aderezos, las salsas y los jarabes aparte. Estos son, con  frecuencia, de alto contenido en calorías, por lo que debe limitar la cantidad que ingiere.  ? Si desea una ensalada, elija una de hortalizas y pida carnes a la parrilla. Evite las guarniciones adicionales como el tocino, el queso o los alimentos fritos. Ordene el aderezo aparte o pida aceite de oliva y vinagre o limón para aderezar.  · Haga un cálculo estimativo de la cantidad de porciones que le sirven. Conocer el tamaño de las porciones lo ayudará a estar atento a   la cantidad de comida que come en los restaurantes.  Dónde buscar más información  · Centers for Disease Control and Prevention (Centros para el Control y la Prevención de Enfermedades): www.cdc.gov  · [place] Department of Agriculture (Departamento de Agricultura de los [place]): myplate.gov  Resumen  · El recuento de calorías es el registro de la cantidad de calorías que se comen y beben cada día. Si come menos calorías de las que el cuerpo necesita, debería bajar de peso.  · Una cantidad de peso saludable para bajar por semana suele ser entre 1 y 2 libras (0.5 a 0.9 kg). Esto significa, con frecuencia, reducir su ingesta diaria de calorías unas 500 a 750 calorías.  · Es posible encontrar la cantidad de calorías que contiene un alimento en la etiqueta de información nutricional. Si un alimento no tiene una etiqueta de información nutricional, intente buscar las calorías en Internet o pida ayuda al nutricionista.  · Use platos, vasos y tazones más pequeños para medir porciones más pequeñas y evitar no comer en exceso.  · Use sus calorías en alimentos y bebidas que lo sacien y no lo dejen con apetito poco tiempo después de haber comido.  Esta información no tiene como fin reemplazar el consejo del médico. Asegúrese de hacerle al médico cualquier pregunta que tenga.  Document Revised: 02/21/2020 Document Reviewed: 02/21/2020  Elsevier Patient Education © 2021 [employer]

## 2021-04-18 NOTE — Progress Notes (Signed)
Date:  04/18/2021   Name:  Caitlin Cross   DOB:  29-Dec-1965   MRN:  [id number]   Chief Complaint: Knee Pain (L) knee pain and swelling), referral colon cancer, and Labs Only (Lipid renal)  Knee Pain  Incident onset: chronic pain years/10 years. There was no injury mechanism. The pain is at a severity of 10/10. The pain is severe. Associated symptoms include an inability to bear weight. The symptoms are aggravated by movement. She has tried NSAIDs and acetaminophen for the symptoms. The treatment provided mild relief.    Lab Results  Component Value Date   CREATININE 0.66 03/02/2020   BUN 14 03/02/2020   NA 142 03/02/2020   K 4.8 03/02/2020   CL 102 03/02/2020   CO2 28 03/02/2020   Lab Results  Component Value Date   CHOL 244 (H) 03/02/2020   HDL 53 03/02/2020   LDLCALC 164 (H) 03/02/2020   TRIG 148 03/02/2020   No results found for: TSH No results found for: HGBA1C No results found for: WBC, HGB, HCT, MCV, PLT No results found for: ALT, AST, GGT, ALKPHOS, BILITOT   Review of Systems  Constitutional: Negative for chills and fever.  HENT: Negative for drooling, ear discharge, ear pain and sore throat.   Respiratory: Negative for cough, shortness of breath and wheezing.   Cardiovascular: Negative for chest pain, palpitations and leg swelling.  Gastrointestinal: Negative for abdominal pain, blood in stool, constipation, diarrhea and nausea.  Endocrine: Negative for polydipsia.  Genitourinary: Negative for dysuria, frequency, hematuria and urgency.  Musculoskeletal: Negative for back pain, myalgias and neck pain.  Skin: Negative for rash.  Allergic/Immunologic: Negative for environmental allergies.  Neurological: Negative for dizziness and headaches.  Hematological: Does not bruise/bleed easily.  Psychiatric/Behavioral: Negative for suicidal ideas. The patient is not nervous/anxious.     There are no problems to display for this patient.   No Known Allergies  Past  Surgical History:  Procedure Laterality Date  . CESAREAN SECTION     x 2    Social History   Tobacco Use  . Smoking status: Former Games developer  . Smokeless tobacco: Never Used  Vaping Use  . Vaping Use: Never used  Substance Use Topics  . Alcohol use: Yes    Comment: social  . Drug use: Never     Medication list has been reviewed and updated.  Current Meds  Medication Sig  . Multiple Vitamins-Minerals (WOMENS MULTIVITAMIN PO) Take by mouth.  . Omega-3 Fatty Acids (FISH OIL) 1000 MG CAPS Take by mouth.    PHQ 2/9 Scores 04/18/2021 03/02/2020  PHQ - 2 Score 0 0  PHQ- 9 Score 0 0    GAD 7 : Generalized Anxiety Score 04/18/2021 03/02/2020  Nervous, Anxious, on Edge 0 0  Control/stop worrying 0 0  Worry too much - different things 0 0  Trouble relaxing 0 0  Restless 0 0  Easily annoyed or irritable 0 0  Afraid - awful might happen 0 0  Total GAD 7 Score 0 0    BP Readings from Last 3 Encounters:  04/18/21 122/62  01/07/21 140/87  11/29/20 118/84    Physical Exam Vitals and nursing note reviewed.  Constitutional:      Appearance: She is well-developed.  HENT:     Head: Normocephalic.     Right Ear: External ear normal.     Left Ear: External ear normal.  Eyes:     General: Lids are everted, no foreign bodies  appreciated. No scleral icterus.       Left eye: No foreign body or hordeolum.     Conjunctiva/sclera: Conjunctivae normal.     Right eye: Right conjunctiva is not injected.     Left eye: Left conjunctiva is not injected.     Pupils: Pupils are equal, round, and reactive to light.  Neck:     Thyroid: No thyromegaly.     Vascular: No JVD.     Trachea: No tracheal deviation.  Cardiovascular:     Rate and Rhythm: Normal rate and regular rhythm.     Heart sounds: Normal heart sounds. No murmur heard. No friction rub. No gallop.   Pulmonary:     Effort: Pulmonary effort is normal. No respiratory distress.     Breath sounds: Normal breath sounds. No wheezing or  rales.  Abdominal:     General: Bowel sounds are normal.     Palpations: Abdomen is soft. There is no mass.     Tenderness: There is no abdominal tenderness. There is no guarding or rebound.  Musculoskeletal:        General: Normal range of motion.     Cervical back: Normal range of motion and neck supple.     Left knee: No effusion or crepitus. Tenderness present over the medial joint line. No lateral joint line, MCL, LCL, ACL or patellar tendon tenderness. No LCL laxity, MCL laxity or ACL laxity.Normal meniscus and normal patellar mobility.     Instability Tests: Anterior drawer test negative.  Lymphadenopathy:     Cervical: No cervical adenopathy.  Skin:    General: Skin is warm.     Findings: No rash.  Neurological:     Mental Status: She is alert and oriented to person, place, and time.     Cranial Nerves: No cranial nerve deficit.     Deep Tendon Reflexes: Reflexes normal.  Psychiatric:        Mood and Affect: Mood is not anxious or depressed.     Wt Readings from Last 3 Encounters:  04/18/21 205 lb (93 kg)  01/07/21 202 lb (91.6 kg)  03/22/20 201 lb (91.2 kg)    BP 122/62   Pulse 64   Ht 5\' 6"  (1.676 m)   Wt 205 lb (93 kg)   BMI 33.09 kg/m   Assessment and Plan: 1. Chronic pain of left knee Chronic.  Knee pain for 10 years which she is treated with NSAIDs and glucosamine with osteochondral ointment.  Exam is consistent with possible meniscal concern versus osteoarthritis.  We will initiate meloxicam 15 mg once a day.  We will obtain a complete knee of the knee.  And we will refer to Dr. sports medicine for evaluation in the near future. - DG Knee Complete 4 Views Left; Future - meloxicam (MOBIC) 15 MG tablet; Take 1 tablet (15 mg total) by mouth daily.  Dispense: 30 tablet; Refill: 0  2. BMI 33.0-33.9,adult Noted weight and patient has been given diet to lose weight which will help her we will obtain a lipid panel and renal function panel to determine  status of current labs. - Lipid Panel With LDL/HDL Ratio - Renal Function Panel  3. Healthcare maintenance Reviewed health maintenance and she is followed by GYN and she is up-to-date on her Paps.  And mammograms.  4. Colon cancer screening Patient desires colon cancer screening and we have made referrals to gastroenterology to proceed in that direction. - Ambulatory referral to Gastroenterology

## 2021-04-19 LAB — RENAL FUNCTION PANEL
Albumin: 4.5 g/dL (ref 3.8–4.9)
BUN/Creatinine Ratio: 21 (ref 9–23)
BUN: 14 mg/dL (ref 6–24)
CO2: 28 mmol/L (ref 20–29)
Calcium: 9.5 mg/dL (ref 8.7–10.2)
Chloride: 103 mmol/L (ref 96–106)
Creatinine, Ser: 0.67 mg/dL (ref 0.57–1.00)
Glucose: 98 mg/dL (ref 65–99)
Phosphorus: 3.5 mg/dL (ref 3.0–4.3)
Potassium: 4.3 mmol/L (ref 3.5–5.2)
Sodium: 143 mmol/L (ref 134–144)
eGFR: 103 mL/min/{1.73_m2} (ref >59)

## 2021-04-19 LAB — LIPID PANEL WITH LDL/HDL RATIO
Cholesterol, Total: 323 mg/dL — ABNORMAL HIGH (ref 100–199)
HDL: 55 mg/dL (ref >39)
LDL Chol Calc (NIH): 248 mg/dL — ABNORMAL HIGH (ref 0–99)
LDL/HDL Ratio: 4.5 ratio — ABNORMAL HIGH (ref 0.0–3.2)
Triglycerides: 112 mg/dL (ref 0–149)
VLDL Cholesterol Cal: 20 mg/dL (ref 5–40)

## 2021-04-20 ENCOUNTER — Other Ambulatory Visit: Payer: Self-pay

## 2021-04-20 DIAGNOSIS — E782 Mixed hyperlipidemia: Secondary | ICD-10-CM

## 2021-04-20 MED ORDER — ROSUVASTATIN CALCIUM 10 MG PO TABS: 10.0000 mg | ORAL_TABLET | Freq: Every day | ORAL | 1 refills | Status: DC

## 2021-04-20 NOTE — Progress Notes (Signed)
Sent in Crestor 10mg 

## 2021-04-23 ENCOUNTER — Encounter: Payer: Self-pay | Admitting: *Deleted

## 2021-04-30 ENCOUNTER — Ambulatory Visit (INDEPENDENT_AMBULATORY_CARE_PROVIDER_SITE_OTHER): Payer: BC Managed Care – PPO | Admitting: Family Medicine

## 2021-04-30 ENCOUNTER — Inpatient Hospital Stay (INDEPENDENT_AMBULATORY_CARE_PROVIDER_SITE_OTHER): Payer: BC Managed Care – PPO | Admitting: Radiology

## 2021-04-30 ENCOUNTER — Other Ambulatory Visit: Payer: Self-pay

## 2021-04-30 ENCOUNTER — Ambulatory Visit: Payer: Self-pay

## 2021-04-30 ENCOUNTER — Encounter: Payer: Self-pay | Admitting: Family Medicine

## 2021-04-30 ENCOUNTER — Inpatient Hospital Stay: Payer: Self-pay | Admitting: Radiology

## 2021-04-30 VITALS — BP 110/72 | HR 72 | Temp 98.2°F | Ht 66.0 in | Wt 205.0 lb

## 2021-04-30 DIAGNOSIS — M1712 Unilateral primary osteoarthritis, left knee: Secondary | ICD-10-CM

## 2021-04-30 MED ORDER — TRIAMCINOLONE ACETONIDE 40 MG/ML IJ SUSP
40.0000 mg | Freq: Once | INTRAMUSCULAR | Status: AC
  Administered 2021-04-30: 40 mg

## 2021-04-30 NOTE — Addendum Note (Signed)
Addended by: Monica Becton on: 04/30/2021 05:14 PM   Modules accepted: Orders

## 2021-04-30 NOTE — Assessment & Plan Note (Signed)
Patient with longstanding (years) history of atraumatic left knee pain primary localized to the anteromedial knee with recent flare.  She is in a physically demanding job.  She does have intermittent episodes of swelling, not currently.  Pain primarily with ascending/descending stairs, prolonged time on feet.  Treatments have included rest, ice, and recent meloxicam which has provided benefit though she has noted some mild GI upset.  Her x-rays today confirm severe medial and moderate patellofemoral osteoarthritis.  Her physical exam findings are consistent with maximal pain at the medial joint line, secondarily to the inferomedial patellar facet, no ligamentous laxity appreciated.  No effusion noted.  We reviewed various strategies moving forward and she is amenable to ultrasound-guided corticosteroid injection which we performed in the office today.  Postinjection care outlined.  He is to continue meloxicam on an as-needed basis until symptoms respond to cortisone.  I have provided home exercises for her to begin and we will reach out to obtain coverage information for viscosupplementation.  She is to return once scheduled.

## 2021-04-30 NOTE — Progress Notes (Signed)
Primary Care / Sports Medicine Office Visit  Patient Information:  Patient ID: Caitlin Cross, female DOB: 1966-10-24 Age: 55 y.o. MRN: [id number]   Caitlin Cross is a pleasant 55 y.o. female presenting with the following:  Chief Complaint  Patient presents with   New Patient (Initial Visit)    Interpreter Services: [employer]#[id number]   Knee Pain    Left; chronic x10 years; left knee X-Ray 04/18/21 shows osteoarthritis; taking meloxicam 15 mg daily with some relief; 3/10 pain in office    Review of Systems pertinent details above   Patient Active Problem List   Diagnosis Date Noted   Primary osteoarthritis of left knee 04/30/2021   Past Medical History:  Diagnosis Date   Anemia    Outpatient Encounter Medications as of 04/30/2021  Medication Sig   Ascorbic Acid (VITAMIN C) 1000 MG tablet Take 2,000 mg by mouth daily.   meloxicam (MOBIC) 15 MG tablet Take 1 tablet (15 mg total) by mouth daily.   Multiple Vitamins-Minerals (WOMENS MULTIVITAMIN PO) Take 1 tablet by mouth daily.   Omega-3 Fatty Acids (FISH OIL) 1000 MG CAPS Take 1 capsule by mouth daily.   rosuvastatin (CRESTOR) 10 MG tablet Take 1 tablet (10 mg total) by mouth daily.   No facility-administered encounter medications on file as of 04/30/2021.   Past Surgical History:  Procedure Laterality Date   CESAREAN SECTION     x 2    Vitals:   04/30/21 1446  BP: 110/72  Pulse: 72  Temp: 98.2 F (36.8 C)  SpO2: 98%   Vitals:   04/30/21 1446  Weight: 205 lb (93 kg)  Height: 5\' 6"  (1.676 m)   Body mass index is 33.09 kg/m.  DG Knee Complete 4 Views Left  Result Date: 04/20/2021 CLINICAL DATA:  Chronic knee pain over the last 10 years. EXAM: LEFT KNEE - COMPLETE 4+ VIEW COMPARISON:  None. FINDINGS: Small amount of joint fluid. Medial compartment joint space narrowing with marginal osteophytes consistent with ordinary osteoarthritis. Lateral compartment and patellofemoral compartment appear normal. No other  focal finding. IMPRESSION: Medial weight-bearing compartment osteoarthritis. Small amount of joint fluid. Electronically Signed   By: 06/20/2021 M.D.   On: 04/20/2021 13:57     Independent interpretation of notes and tests performed by another provider:   Independent interpretation of left knee x-ray dated 04/20/2021 reveals severe medial tibiofemoral joint space narrowing, subchondral sclerosis, moderate patellar facet osteophyte formation with associated joint space narrowing noted, no acute osseous process identified  Procedures performed:   Procedure:  Injection of left knee anteromedial joint under ultrasound guidance. Ultrasound guidance utilized for needle placement, confirmation of joint line, sonographic response of injectate noted Samsung HS60 device utilized with permanent recording / reporting. Consent obtained and verified. Skin prepped in a sterile fashion. Ethyl chloride spray for topical local analgesia.  Completed without difficulty and tolerated well. Medication: triamcinolone acetonide 40 mg/mL suspension for injection 1 mL total and 2 mL lidocaine 1% without epinephrine utilized for needle placement anesthetic Advised to contact for fevers/chills, erythema, induration, drainage, or persistent bleeding.   Pertinent History, Exam, Impression, and Recommendations:   Primary osteoarthritis of left knee Patient with longstanding (years) history of atraumatic left knee pain primary localized to the anteromedial knee with recent flare.  She is in a physically demanding job.  She does have intermittent episodes of swelling, not currently.  Pain primarily with ascending/descending stairs, prolonged time on feet.  Treatments have included rest, ice, and recent meloxicam  which has provided benefit though she has noted some mild GI upset.  Her x-rays today confirm severe medial and moderate patellofemoral osteoarthritis.  Her physical exam findings are consistent with maximal pain at  the medial joint line, secondarily to the inferomedial patellar facet, no ligamentous laxity appreciated.  No effusion noted.  We reviewed various strategies moving forward and she is amenable to ultrasound-guided corticosteroid injection which we performed in the office today.  Postinjection care outlined.  He is to continue meloxicam on an as-needed basis until symptoms respond to cortisone.  I have provided home exercises for her to begin and we will reach out to obtain coverage information for viscosupplementation.  She is to return once scheduled.    Orders & Medications No orders of the defined types were placed in this encounter.  Orders Placed This Encounter  Procedures   [place] LIMITED JOINT SPACE STRUCTURES LOW LEFT   [place] LIMITED JOINT SPACE STRUCTURES LOW LEFT   [place] LIMITED JOINT SPACE STRUCTURES LOW LEFT     Return We will contact patient to schedule.     Caitlin Banana, MD   Primary Care Sports Medicine [facility]

## 2021-04-30 NOTE — Addendum Note (Signed)
Addended by: Lennart Pall on: 04/30/2021 04:23 PM   Modules accepted: Orders

## 2021-04-30 NOTE — Patient Instructions (Signed)
You have just been given a cortisone injection to reduce pain and inflammation. After the injection you may notice immediate relief of pain as a result of the Lidocaine. It is important to rest the area of the injection for 24 to 48 hours after the injection. There is a possibility of some temporary increased discomfort and swelling for up to 72 hours until the cortisone begins to work. If you do have pain, simply rest the joint and use ice. If you can tolerate over the counter medications, you can try Tylenol, Aleve, or Advil for added relief per package instructions. - Our office will contact you in regards to gel injection scheduling and follow-up - Utilize knee brace for prolonged time on, remove for bedtime/bathing - Start home exercises in 1 week with information provided - Continue meloxicam on an as-needed basis until symptoms respond to cortisone

## 2021-05-01 NOTE — Addendum Note (Signed)
Addended by: Annita Brod on: 05/01/2021 11:27 AM   Modules accepted: Orders

## 2021-05-04 ENCOUNTER — Telehealth: Payer: Self-pay

## 2021-05-04 DIAGNOSIS — M1712 Unilateral primary osteoarthritis, left knee: Secondary | ICD-10-CM

## 2021-05-04 NOTE — Telephone Encounter (Signed)
Called patient to notify that her insurance criteria to get approved for Monovisc gel injections is for patient to have failed conservative treatment of Physical Therapy along with acetaminophen and/or NSAIDs. Left message for patient to return call. Physical Therapy referral placed.  For your information.

## 2021-05-07 ENCOUNTER — Ambulatory Visit: Payer: BC Managed Care – PPO | Admitting: Family Medicine

## 2021-05-08 NOTE — Progress Notes (Deleted)
Error

## 2021-05-10 NOTE — Progress Notes (Signed)
error 

## 2021-05-21 ENCOUNTER — Ambulatory Visit: Payer: BC Managed Care – PPO | Attending: Family Medicine

## 2021-05-21 ENCOUNTER — Other Ambulatory Visit: Payer: Self-pay

## 2021-05-21 DIAGNOSIS — M6281 Muscle weakness (generalized): Secondary | ICD-10-CM | POA: Diagnosis present

## 2021-05-21 DIAGNOSIS — M25562 Pain in left knee: Secondary | ICD-10-CM | POA: Insufficient documentation

## 2021-05-21 DIAGNOSIS — G8929 Other chronic pain: Secondary | ICD-10-CM | POA: Insufficient documentation

## 2021-05-21 NOTE — Therapy (Signed)
[facility] [address] [place], [place], [address] Phone: [phone]   Fax:  [phone]  Physical Therapy Evaluation  Patient Details  Name: Caitlin BuntingMary Cross MRN: [id number] Date of Birth: 05-16-1966 Referring Provider (PT): Dr. Ashley RoyaltyMatthews  Encounter Date: 05/21/2021   PT End of Session - 05/23/21 0906     Visit Number 1    Number of Visits 17    Date for PT Re-Evaluation 07/16/21    Authorization Type eval: 05/21/21    PT Start Time 1615    PT Stop Time 1700    PT Time Calculation (min) 45 min    Activity Tolerance Patient tolerated treatment well    Behavior During Therapy [facility] for tasks assessed/performed             Past Medical History:  Diagnosis Date   Anemia     Past Surgical History:  Procedure Laterality Date   CESAREAN SECTION     x 2    There were no vitals filed for this visit.    Subjective Assessment - 05/23/21 0854     Subjective Left knee pain    Pertinent History Pt reports approximately 8 years of left knee pain localized to the anterior medial left knee with intermediate episodes of swelling. She gets occasional redness in the medial part of the left knee. 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 She uses ice and elevation to control her pain. She reports imaging of her L knee in 2019 and states that orthopedic surgery suggested knee surgery however she declined. She is unclear what type of knee surgery they were suggesting. She saw Dr. Ashley RoyaltyMatthews on 04/30/21 and plain film radiographs showed severe medial and moderate patellofemoral osteoarthritis. He prescribed meloxicam which has helped significantly. She does report mild GI upset since starting the medication. She also underwent an ultrasound-guided corticosteroid injection into her L knee and she  reports approximately 40% improvement since the injection. She had never previously had a steroid injection in her L knee. MD is currently seeking coverage information for visco supplementation. Pt states that she has tried glucosamine in the past which has been helpful. She has tried compressions stockings to help with the swelling as well as a knee brace. States she has been screened for systemic causes of pain and denies history of rheumatoid arthritis. Red flags negative.    Limitations Walking;Standing    How long can you sit comfortably? 30 minutes    How long can you stand comfortably? 30 minutes    How long can you walk comfortably? 30 minutes    Diagnostic tests See history    Patient Stated Goals Decrease left knee pain in order to complete her responsibilities at home and work    Currently in Pain? No/denies                [facility] - 05/23/21 0855       Assessment   Medical Diagnosis Primary OA of L knee    Referring Provider (PT) Dr. Ashley RoyaltyMatthews    Onset Date/Surgical Date 05/21/13    Next MD Visit Not reported    Prior Therapy None for this issue      Precautions   Precautions None      Restrictions   Weight Bearing Restrictions No  SUBJECTIVE Onset: Pt reports approximately 8 years of left knee pain localized to the anterior medial left knee with intermediate episodes of swelling. She gets occasional redness in the medial part of the left knee. 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 She uses ice and elevation to control her pain. She reports imaging of her L knee in 2019 and states that orthopedic surgery suggested knee surgery however she declined. She is unclear what type of knee surgery they were suggesting. She saw Dr. Ashley Royalty on 04/30/21 and plain film radiographs showed severe  medial and moderate patellofemoral osteoarthritis. He prescribed meloxicam which has helped significantly. She does report mild GI upset since starting the medication. She also underwent an ultrasound-guided corticosteroid injection into her L knee and she reports approximately 40% improvement since the injection. She had never previously had a steroid injection in her L knee. MD is currently seeking coverage information for visco supplementation. Pt states that she has tried glucosamine in the past which has been helpful. She has tried compressions stockings to help with the swelling as well as a knee brace. States she has been screened for systemic causes of pain and denies history of rheumatoid arthritis. Red flags negative.  Follow-up appt with MD: Pain: 10/10 Present, 10/10 Best, 10/10 Worst: Aggravating factors: twisting on L knee, extended standing, excessive pressure on knee, steps, extensive walking Easing factors: ice, meloxicam; Walking: 30 minutes Standing: 30 minutes; 24 hour pain behavior: Worse at the end of the day after activity.  Knee surgery: No Recent knee trauma: No Prior history of knee injury or pain: No Pain quality: pain quality: burning Radiating symptoms: Yes, down the leg to the foot  Numbness/Tingling: Yes, numbness in the R foot Imaging: Yes, see history    OBJECTIVE  MUSCULOSKELETAL: Tremor: Absent Bulk: Normal Tone: Normal, no spasticity, rigidity, or clonus No trophic changes noted to lower extremities. No ecchymosis, erythema, or edema noted around L knee.   Posture No gross abnormalities noted in standing or seated posture  Gait No gross deficits in gait identified. Pt denies L knee pain during ambulation during evaluation.  Palpation Pain to palpation along medial joint line of knee as well as medial thigh just superior to the knee over the medial epicondyle . No pain over patellar tendon. No pain with palpation to quadriceps or hamstrings. Pain  with patellar compression L knee;  Strength R/L 4+/4 Hip flexion 4-/4- Hip extension  4-/4- Hip abduction 4-/4- Hip adduction 5/4* Knee extension 4-/4- Knee flexion 5/5 Ankle Dorsiflexion *indicates pain   AROM  Lumbar AROM: Lumbar and hip: WFL and painless with overpressure in all planes Hip: [facility]   Hip R/L Flexion: WFL Extension: WFL Abduction: WFL Adduction: WFL Internal Rotation: WFL External Rotation: WFL *indicates pain  L Knee Flexion: 135  Extension: 0 *indicates pain  Muscle Length Hamstring length: 80/80; Quad length [facility]): Mild limitation in quad length bilaterally, no knee pain bilaterally during testing;   Passive Accessory Motion Deferred  NEUROLOGICAL:  Mental Status Patient is oriented to person, place and time.  Recent memory is intact.  Remote memory is intact.  Attention span and concentration are intact.  Expressive speech is intact.  Patient's fund of knowledge is within normal limits for educational level.  Sensation Deferred  Reflexes Deferred  SPECIAL TESTS Deferred  Objective measurements completed on examination: See above findings.                 PT Short Term Goals - 05/23/21 0934       PT SHORT TERM GOAL #1   Title Pt will be independent with HEP in order to decrease knee pain and increase strength in order to improve pain-free function at home and work.    Time 4    Period Weeks    Status New    Target Date 06/18/21               PT Long Term Goals - 05/23/21 0935       PT LONG TERM GOAL #1   Title Pt will decrease worst pain as reported on NPRS by at least 3 points in order to demonstrate clinically significant reduction in ankle/foot pain.    Baseline 05/21/21: 10/10    Time 8    Period Weeks    Status New    Target Date 07/16/21      PT LONG TERM GOAL #2   Title Pt will increase FOTO to at least 72  in order to demonstrate significant improvement in lower  extremity function.    Baseline 05/21/21: 59    Time 8    Period Weeks    Status New    Target Date 07/16/21      PT LONG TERM GOAL #3   Title Pt will increase LEFS by at least 9 points in order to demonstrate significant improvement in lower extremity function.    Baseline 05/21/21: To be completed at next visit    Time 8    Period Weeks    Status New    Target Date 07/16/21      PT LONG TERM GOAL #4   Title Pt will increase strength of L hip extension, abduction, and addution by at least 1/2 MMT grade in order to demonstrate improvement in strength and function as well as improved stability to L knee    Baseline 05/21/21: 4-/5 for all direction    Time 8    Period Weeks    Status New    Target Date 07/16/21                    Plan - 05/23/21 0931     Clinical Impression Statement Pt is a pleasant 55 year-old female referred for L knee osteoarthritis. Plain film radiographs with Dr. Ashley Royalty showed severe medial and moderate patellofemoral osteoarthritis. PT examination reveals full and painless active range of motion with overpressure of lumbar spine, left hip, and left knee. She reports pain to palpation along medial joint line of left knee as well as medial thigh just superior to the knee over the medial epicondyle. No pain over patellar tendon. No pain with palpation to quadriceps or hamstrings.  Positive pain with patellar compression.  Weak and painful left knee extension as well as weakness with bilateral hip extension, abduction, and adduction bilaterally however no pain reported.  No gross deficits or pain during gait today patient reports wearing supportive footwear at work as well as when walking for exercise at home. Pt will benefit from PT services to address deficits in strength and pain in order to return to full function at home and work with less knee pain.    Personal Factors and Comorbidities Age;Past/Current Experience;Time since onset of  injury/illness/exacerbation    Examination-Activity Limitations Locomotion Level;Sit;Stand    Examination-Participation Restrictions  Community Activity;Occupation    Stability/Clinical Decision Making Stable/Uncomplicated    Clinical Decision Making Low    Rehab Potential Good    PT Frequency 2x / week    PT Duration 8 weeks    PT Treatment/Interventions ADLs/Self Care Home Management;Biofeedback;Aquatic Therapy;Canalith Repostioning;Cryotherapy;Electrical Stimulation;Iontophoresis 4mg /ml Dexamethasone;Moist Heat;Traction;Ultrasound;Contrast Bath;DME Instruction;Gait training;Stair training;Functional mobility training;Therapeutic activities;Therapeutic exercise;Balance training;Neuromuscular re-education;Patient/family education;Manual techniques;Passive range of motion;Dry needling;Vestibular;Spinal Manipulations;Joint Manipulations    PT Next Visit Plan Special testing of left knee, have patient complete LEFS, passive accessory testing of knee, initiate HEP    PT Home Exercise Plan None currently    Consulted and Agree with Plan of Care Patient             Patient will benefit from skilled therapeutic intervention in order to improve the following deficits and impairments:  Decreased strength, Pain  Visit Diagnosis: Chronic pain of left knee  Muscle weakness (generalized)     Problem List Patient Active Problem List   Diagnosis Date Noted   Primary osteoarthritis of left knee 04/30/2021   05/02/2021 PT, DPT, GCS  Rahul Malinak 05/23/2021, 9:43 AM  [facility] [address] [place], [place], [place] Phone: [phone]   Fax:  [phone]  Name: Lailanie Hasley MRN: Caitlin Cross Date of Birth: 03-Nov-1966

## 2021-05-28 ENCOUNTER — Ambulatory Visit: Payer: BC Managed Care – PPO

## 2021-05-28 ENCOUNTER — Other Ambulatory Visit: Payer: Self-pay

## 2021-05-28 DIAGNOSIS — M25562 Pain in left knee: Secondary | ICD-10-CM | POA: Diagnosis not present

## 2021-05-28 DIAGNOSIS — M6281 Muscle weakness (generalized): Secondary | ICD-10-CM

## 2021-05-28 DIAGNOSIS — G8929 Other chronic pain: Secondary | ICD-10-CM

## 2021-05-28 NOTE — Patient Instructions (Signed)
Access Code: [email or web address] URL: [email or web address] Date: 05/28/2021 Prepared by: Ria Comment  Exercises Sidelying Hip Abduction (Mirrored) - 1 x daily - 7 x weekly - 2 sets - 10 reps - 3s hold Clamshell - 1 x daily - 7 x weekly - 2 sets - 10 reps - 3s hold Supine Quad Set - 1 x daily - 7 x weekly - 2 sets - 10 reps - 3s hold Active Straight Leg Raise with Quad Set (Mirrored) - 1 x daily - 7 x weekly - 2 sets - 10 reps - 3s hold

## 2021-05-28 NOTE — Therapy (Signed)
[facility] [address] [place], [place], [address] Phone: [phone]   Fax:  [phone]  Physical Therapy Treatment  Patient Details  Name: Caitlin Cross MRN: [id number] Date of Birth: Jun 21, 1966 Referring Provider (PT): Dr. Ashley Royalty   Encounter Date: 05/28/2021   PT End of Session - 05/28/21 1625     Visit Number 2    Number of Visits 17    Date for PT Re-Evaluation 07/16/21    Authorization Type eval: 05/21/21    PT Start Time 1620    PT Stop Time 1700    PT Time Calculation (min) 40 min    Activity Tolerance Patient tolerated treatment well    Behavior During Therapy [facility] for tasks assessed/performed             Past Medical History:  Diagnosis Date   Anemia     Past Surgical History:  Procedure Laterality Date   CESAREAN SECTION     x 2    There were no vitals filed for this visit.   Subjective Assessment - 05/28/21 1624     Subjective Pt reports that she is doing alright today but is tired after working hard today. She reports 4/10 L knee pain upon arrival today. No significant changes in L knee since initial evaluation.    Pertinent History Pt reports approximately 8 years of left knee pain localized to the anterior medial left knee with intermediate episodes of swelling. She gets occasional redness in the medial part of the left knee. 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 She uses ice and elevation to control her pain. She reports imaging of her L knee in 2019 and states that orthopedic surgery suggested knee surgery however she declined. She is unclear what type of knee surgery they were suggesting. She saw Dr. Ashley Royalty on 04/30/21 and plain film radiographs showed severe medial and moderate patellofemoral osteoarthritis. He prescribed meloxicam which has  helped significantly. She does report mild GI upset since starting the medication. She also underwent an ultrasound-guided corticosteroid injection into her L knee and she reports approximately 40% improvement since the injection. She had never previously had a steroid injection in her L knee. MD is currently seeking coverage information for visco supplementation. Pt states that she has tried glucosamine in the past which has been helpful. She has tried compressions stockings to help with the swelling as well as a knee brace. States she has been screened for systemic causes of pain and denies history of rheumatoid arthritis. Red flags negative.    Limitations Walking;Standing    How long can you sit comfortably? 30 minutes    How long can you stand comfortably? 30 minutes    How long can you walk comfortably? 30 minutes    Diagnostic tests See history    Patient Stated Goals Decrease left knee pain in order to complete her responsibilities at home and work                TREATMENT  Ther-ex   SPECIAL TESTS  Ligamentous Stability  Anterior Drawer Test: Negative Lachman Test: Negative Posterior Drawer Test: Negative Posterior Sag Sign: Negative Valgus Stress Test: Negative Varus Stress Test: Negative  Meniscus Tests McMurray Test: Negative All other tests deferred  L quad sets 2 x 10; L SLR initiating  with quad set 2 x 10; L SAQ with manual resistance from therapist 2 x 10; Hooklying bridges 2 x 10; L side-lying R hip straight leg abduction 2 x 10; L side-lying R hip clams with manual resistance from therapist 2 x 10; L side-lying R hip reverse clams 2 x 10, washcloth between knees to minimize pain; HEP issued with education;   Manual Therapy  Superior, inferior, right lateral, left lateral patellar mobilizations, grade 1-2, 30 seconds per bout x1 bout each;   Pt educated throughout session about proper posture and technique with exercises. Improved exercise technique,  movement at target joints, use of target muscles after min to mod verbal, visual, tactile cues.    Performed additional testing of left knee without any ligamentous instability noted.  Initiated strengthening today and patient is able to perform without any increase in her pain.  Also performed low-grade patellar mobilizations for pain modulation.  Patient encouraged to initiate HEP and follow-up as scheduled.  Patient will benefit from skilled PT services to address deficits and left knee pain in order to improve pain-free function at home and with work.                           PT Short Term Goals - 05/23/21 0934       PT SHORT TERM GOAL #1   Title Pt will be independent with HEP in order to decrease knee pain and increase strength in order to improve pain-free function at home and work.    Time 4    Period Weeks    Status New    Target Date 06/18/21               PT Long Term Goals - 05/28/21 1720       PT LONG TERM GOAL #1   Title Pt will decrease worst pain as reported on NPRS by at least 3 points in order to demonstrate clinically significant reduction in ankle/foot pain.    Baseline 05/21/21: 10/10    Time 8    Period Weeks    Status New    Target Date 07/16/21      PT LONG TERM GOAL #2   Title Pt will increase FOTO to at least 72  in order to demonstrate significant improvement in lower extremity function.    Baseline 05/21/21: 59    Time 8    Period Weeks    Status New    Target Date 07/16/21      PT LONG TERM GOAL #3   Title Pt will increase LEFS by at least 9 points in order to demonstrate significant improvement in lower extremity function.    Baseline 05/21/21: To be completed at next visit    Time 8    Period Weeks    Status New    Target Date 07/16/21      PT LONG TERM GOAL #4   Title Pt will increase strength of L hip extension, abduction, and addution by at least 1/2 MMT grade in order to demonstrate improvement in strength and  function as well as improved stability to L knee    Baseline 05/21/21: 4-/5 for all direction    Time 8    Period Weeks    Status New    Target Date 07/16/21                   Plan - 05/28/21 1626     Clinical  Impression Statement Performed additional testing of left knee without any ligamentous instability noted.  Initiated strengthening today and patient is able to perform without any increase in her pain.  Also performed low-grade patellar mobilizations for pain modulation.  Patient encouraged to initiate HEP and follow-up as scheduled.  Patient will benefit from skilled PT services to address deficits and left knee pain in order to improve pain-free function at home and with work.    Personal Factors and Comorbidities Age;Past/Current Experience;Time since onset of injury/illness/exacerbation    Examination-Activity Limitations Locomotion Level;Sit;Stand    Examination-Participation Restrictions Community Activity;Occupation    Stability/Clinical Decision Making Stable/Uncomplicated    Rehab Potential Good    PT Frequency 2x / week    PT Duration 8 weeks    PT Treatment/Interventions ADLs/Self Care Home Management;Biofeedback;Aquatic Therapy;Canalith Repostioning;Cryotherapy;Electrical Stimulation;Iontophoresis 4mg /ml Dexamethasone;Moist Heat;Traction;Ultrasound;Contrast Bath;DME Instruction;Gait training;Stair training;Functional mobility training;Therapeutic activities;Therapeutic exercise;Balance training;Neuromuscular re-education;Patient/family education;Manual techniques;Passive range of motion;Dry needling;Vestibular;Spinal Manipulations;Joint Manipulations    PT Next Visit Plan Special testing of left knee, have patient complete LEFS, passive accessory testing of knee, initiate HEP    PT Home Exercise Plan None currently    Consulted and Agree with Plan of Care Patient             Patient will benefit from skilled therapeutic intervention in order to improve the  following deficits and impairments:  Decreased strength, Pain  Visit Diagnosis: Chronic pain of left knee  Muscle weakness (generalized)     Problem List Patient Active Problem List   Diagnosis Date Noted   Primary osteoarthritis of left knee 04/30/2021   05/02/2021 PT, DPT, GCS  Zooey Schreurs 05/28/2021, 5:23 PM  [facility] [address] [place], [place], [place] Phone: [phone]   Fax:  [phone]  Name: Rainee Sweatt MRN: Stana Bunting Date of Birth: 27-Aug-1966

## 2021-05-31 ENCOUNTER — Other Ambulatory Visit: Payer: Self-pay

## 2021-05-31 ENCOUNTER — Ambulatory Visit: Payer: BC Managed Care – PPO

## 2021-05-31 DIAGNOSIS — M25562 Pain in left knee: Secondary | ICD-10-CM | POA: Diagnosis not present

## 2021-05-31 DIAGNOSIS — M6281 Muscle weakness (generalized): Secondary | ICD-10-CM

## 2021-05-31 DIAGNOSIS — G8929 Other chronic pain: Secondary | ICD-10-CM

## 2021-05-31 NOTE — Therapy (Signed)
[facility] [address] [place], [place], [address] Phone: [phone]   Fax:  [phone]  Physical Therapy Treatment  Patient Details  Name: Caitlin Cross MRN: [id number] Date of Birth: 10/21/1966 Referring Provider (PT): Dr. Ashley Royalty   Encounter Date: 05/31/2021   PT End of Session - 05/31/21 1710     Visit Number 3    Number of Visits 17    Date for PT Re-Evaluation 07/16/21    Authorization Type eval: 05/21/21    PT Start Time 1705    PT Stop Time 1750    PT Time Calculation (min) 45 min    Activity Tolerance Patient tolerated treatment well    Behavior During Therapy [facility] for tasks assessed/performed              Past Medical History:  Diagnosis Date   Anemia     Past Surgical History:  Procedure Laterality Date   CESAREAN SECTION     x 2    There were no vitals filed for this visit.   Subjective Assessment - 05/31/21 1709     Subjective Pt reports that she is doing alright today but is tired. She denies any resting L knee pain upon arrival. Overall feels like her knee pain is improving but is still highly variable from day to day. No significant changes since last therapy session.    Pertinent History Pt reports approximately 8 years of left knee pain localized to the anterior medial left knee with intermediate episodes of swelling. She gets occasional redness in the medial part of the left knee. 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 She uses ice and elevation to control her pain. She reports imaging of her L knee in 2019 and states that orthopedic surgery suggested knee surgery however she declined. She is unclear what type of knee surgery they were suggesting. She saw Dr. Ashley Royalty on 04/30/21 and plain film radiographs showed severe medial and moderate  patellofemoral osteoarthritis. He prescribed meloxicam which has helped significantly. She does report mild GI upset since starting the medication. She also underwent an ultrasound-guided corticosteroid injection into her L knee and she reports approximately 40% improvement since the injection. She had never previously had a steroid injection in her L knee. MD is currently seeking coverage information for visco supplementation. Pt states that she has tried glucosamine in the past which has been helpful. She has tried compressions stockings to help with the swelling as well as a knee brace. States she has been screened for systemic causes of pain and denies history of rheumatoid arthritis. Red flags negative.    Limitations Walking;Standing    How long can you sit comfortably? 30 minutes    How long can you stand comfortably? 30 minutes    How long can you walk comfortably? 30 minutes    Diagnostic tests See history    Patient Stated Goals Decrease left knee pain in order to complete her responsibilities at home and work                 TREATMENT  Ther-ex  NuStep L2 x 5 minutes for warm-up during history;   Total Gym L22 double leg squats with green tband around knees to prevent valgus and minimize knee pain 2 x 10 (attempted ball VMO ball squeeze  but increases pain)  Total Gym L22 double leg heel raises 2 x 10;  L quad sets 2 x 10; L SLR with 2# ankle weight (AW) 2 x 10; L SAQ with manual resistance from therapist 3 x 10; Hooklying bridges staggerred stance with LLE back and RLE slightly extended 2 x 10 (attempted L single leg but unable due to weakness); L side-lying R hip straight leg abduction with 2# AW 2 x 10; L side-lying R hip clams with manual resistance from therapist 2 x 10; L side-lying R hip reverse clams with 2# AW, 2 x 10  Manual Therapy  STM with effleurage to medial L knee L knee adductor stretch x 30s;   Pt educated throughout session about proper posture and  technique with exercises. Improved exercise technique, movement at target joints, use of target muscles after min to mod verbal, visual, tactile cues.    Continued with strengthening during session and introduced Total Gym to session. Attempted L single leg bridges however pt lacks adequate strength to perform so instead performed staggered stance bridge. Perform stretches and soft tissue mobilization to help with medial knee pain. Patient will benefit from skilled PT services to address deficits and left knee pain in order to improve pain-free function at home and with work.                           PT Short Term Goals - 05/23/21 0934       PT SHORT TERM GOAL #1   Title Pt will be independent with HEP in order to decrease knee pain and increase strength in order to improve pain-free function at home and work.    Time 4    Period Weeks    Status New    Target Date 06/18/21               PT Long Term Goals - 05/31/21 1751       PT LONG TERM GOAL #1   Title Pt will decrease worst pain as reported on NPRS by at least 3 points in order to demonstrate clinically significant reduction in ankle/foot pain.    Baseline 05/21/21: 10/10    Time 8    Period Weeks    Status New    Target Date 07/16/21      PT LONG TERM GOAL #2   Title Pt will increase FOTO to at least 72  in order to demonstrate significant improvement in lower extremity function.    Baseline 05/21/21: 59    Time 8    Period Weeks    Status New    Target Date 07/16/21      PT LONG TERM GOAL #3   Title Pt will increase LEFS by at least 9 points in order to demonstrate significant improvement in lower extremity function.    Baseline 05/21/21: To be completed at next visit; 05/28/21: 52/80    Time 8    Period Weeks    Status New    Target Date 07/16/21      PT LONG TERM GOAL #4   Title Pt will increase strength of L hip extension, abduction, and addution by at least 1/2 MMT grade in order to  demonstrate improvement in strength and function as well as improved stability to L knee    Baseline 05/21/21: 4-/5 for all direction    Time 8    Period Weeks    Status New  Target Date 07/16/21                   Plan - 05/31/21 1710     Clinical Impression Statement Continued with strengthening during session and introduced Total Gym to session. Attempted L single leg bridges however pt lacks adequate strength to perform so instead performed staggered stance bridge. Perform stretches and soft tissue mobilization to help with medial knee pain. Patient will benefit from skilled PT services to address deficits and left knee pain in order to improve pain-free function at home and with work.    Personal Factors and Comorbidities Age;Past/Current Experience;Time since onset of injury/illness/exacerbation    Examination-Activity Limitations Locomotion Level;Sit;Stand    Examination-Participation Restrictions Community Activity;Occupation    Stability/Clinical Decision Making Stable/Uncomplicated    Rehab Potential Good    PT Frequency 2x / week    PT Duration 8 weeks    PT Treatment/Interventions ADLs/Self Care Home Management;Biofeedback;Aquatic Therapy;Canalith Repostioning;Cryotherapy;Electrical Stimulation;Iontophoresis 4mg /ml Dexamethasone;Moist Heat;Traction;Ultrasound;Contrast Bath;DME Instruction;Gait training;Stair training;Functional mobility training;Therapeutic activities;Therapeutic exercise;Balance training;Neuromuscular re-education;Patient/family education;Manual techniques;Passive range of motion;Dry needling;Vestibular;Spinal Manipulations;Joint Manipulations    PT Next Visit Plan LLE strengthening, LLE manual techniques, Review and modify HEP as needed    PT Home Exercise Plan None currently    Consulted and Agree with Plan of Care Patient              Patient will benefit from skilled therapeutic intervention in order to improve the following deficits and  impairments:  Decreased strength, Pain  Visit Diagnosis: Chronic pain of left knee  Muscle weakness (generalized)     Problem List Patient Active Problem List   Diagnosis Date Noted   Primary osteoarthritis of left knee 04/30/2021   05/02/2021 PT, DPT, GCS  Caitlin Cross 06/01/2021, 2:37 PM  [facility] [address] [place], [place], [place] Phone: [phone]   Fax:  [phone]  Name: Caitlin Cross MRN: Stana Bunting Date of Birth: [birth year]

## 2021-06-04 ENCOUNTER — Ambulatory Visit: Payer: BC Managed Care – PPO

## 2021-06-04 ENCOUNTER — Other Ambulatory Visit: Payer: Self-pay

## 2021-06-04 DIAGNOSIS — M25562 Pain in left knee: Secondary | ICD-10-CM | POA: Diagnosis not present

## 2021-06-04 DIAGNOSIS — G8929 Other chronic pain: Secondary | ICD-10-CM

## 2021-06-04 DIAGNOSIS — M6281 Muscle weakness (generalized): Secondary | ICD-10-CM

## 2021-06-04 NOTE — Therapy (Signed)
[facility] [address] [place], [place], [address] Phone: [phone]   Fax:  [phone]  Physical Therapy Treatment  Patient Details  Name: Caitlin Cross MRN: [id number] Date of Birth: 21-Sep-1966 Referring Provider (PT): Dr. Ashley Royalty   Encounter Date: 06/04/2021   PT End of Session - 06/04/21 1620     Visit Number 4    Number of Visits 17    Date for PT Re-Evaluation 07/16/21    Authorization Type eval: 05/21/21    PT Start Time 1617    PT Stop Time 1700    PT Time Calculation (min) 43 min    Activity Tolerance Patient tolerated treatment well    Behavior During Therapy [facility] for tasks assessed/performed              Past Medical History:  Diagnosis Date   Anemia     Past Surgical History:  Procedure Laterality Date   CESAREAN SECTION     x 2    There were no vitals filed for this visit.   Subjective Assessment - 06/04/21 1620     Subjective Pt reports that she is doing alright today. She is tired today after finishing work. She reports 5/10 left knee pain upon arrival. No significant changes since last therapy session.    Pertinent History Pt reports approximately 8 years of left knee pain localized to the anterior medial left knee with intermediate episodes of swelling. She gets occasional redness in the medial part of the left knee. 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 She uses ice and elevation to control her pain. She reports imaging of her L knee in 2019 and states that orthopedic surgery suggested knee surgery however she declined. She is unclear what type of knee surgery they were suggesting. She saw Dr. Ashley Royalty on 04/30/21 and plain film radiographs showed severe medial and moderate patellofemoral osteoarthritis. He prescribed meloxicam which has  helped significantly. She does report mild GI upset since starting the medication. She also underwent an ultrasound-guided corticosteroid injection into her L knee and she reports approximately 40% improvement since the injection. She had never previously had a steroid injection in her L knee. MD is currently seeking coverage information for visco supplementation. Pt states that she has tried glucosamine in the past which has been helpful. She has tried compressions stockings to help with the swelling as well as a knee brace. States she has been screened for systemic causes of pain and denies history of rheumatoid arthritis. Red flags negative.    Limitations Walking;Standing    How long can you sit comfortably? 30 minutes    How long can you stand comfortably? 30 minutes    How long can you walk comfortably? 30 minutes    Diagnostic tests See history    Patient Stated Goals Decrease left knee pain in order to complete her responsibilities at home and work                 TREATMENT   Ther-ex  NuStep L1-4 x 5 minutes for warm-up during history, with therapist adjusting resistance (2 minutes unbilled);   Total Gym L22 double leg squats with green tband around knees to prevent valgus and minimize knee pain 2 x 10; Total Gym L22 double leg heel raises 2 x 10;  L quad sets 2 x 10; L SLR with 3# ankle weight (AW) 2 x 10; L SAQ with manual resistance from therapist 2 x 10; Hooklying bridges staggerred stance with LLE back and RLE slightly extended 2 x 10; R side-lying L hip straight leg abduction with 3# AW 2 x 10; R side-lying L hip clams with manual resistance from therapist 2 x 10;   Manual Therapy  L knee tibia on femur AP and PA mobilizations, grade III, 30s/bout x 3 bouts each for pain modulation; L patella mobs superior, inferior, medial, and lateral, 30 seconds/bout x2 bouts each direction; STM with effleurage and Theraband roller to distal quad and medial L knee;    Pt  educated throughout session about proper posture and technique with exercises. Improved exercise technique, movement at target joints, use of target muscles after min to mod verbal, visual, tactile cues.    Continued with strengthening during session today and increased ankle weight today. Continued with soft tissue mobilization to help with medial knee pain and repeated patellar mobs. Introduced tibia on femur mobilizations for pain modulation. Will continue to progress loading of L knee for strengthening to improve long-term pain control. Patient will benefit from skilled PT services to address deficits and left knee pain in order to improve pain-free function at home and with work.                           PT Short Term Goals - 05/23/21 0934       PT SHORT TERM GOAL #1   Title Pt will be independent with HEP in order to decrease knee pain and increase strength in order to improve pain-free function at home and work.    Time 4    Period Weeks    Status New    Target Date 06/18/21               PT Long Term Goals - 05/31/21 1751       PT LONG TERM GOAL #1   Title Pt will decrease worst pain as reported on NPRS by at least 3 points in order to demonstrate clinically significant reduction in ankle/foot pain.    Baseline 05/21/21: 10/10    Time 8    Period Weeks    Status New    Target Date 07/16/21      PT LONG TERM GOAL #2   Title Pt will increase FOTO to at least 72  in order to demonstrate significant improvement in lower extremity function.    Baseline 05/21/21: 59    Time 8    Period Weeks    Status New    Target Date 07/16/21      PT LONG TERM GOAL #3   Title Pt will increase LEFS by at least 9 points in order to demonstrate significant improvement in lower extremity function.    Baseline 05/21/21: To be completed at next visit; 05/28/21: 52/80    Time 8    Period Weeks    Status New    Target Date 07/16/21      PT LONG TERM GOAL #4   Title  Pt will increase strength of L hip extension, abduction, and addution by at least 1/2 MMT grade in order to demonstrate improvement in strength and function as well as improved stability to L knee    Baseline 05/21/21: 4-/5 for all direction    Time 8    Period Weeks  Status New    Target Date 07/16/21                   Plan - 06/04/21 1621     Clinical Impression Statement Continued with strengthening during session today and increased ankle weight today. Continued with soft tissue mobilization to help with medial knee pain and repeated patellar mobs. Introduced tibia on femur mobilizations for pain modulation. Will continue to progress loading of L knee for strengthening to improve long-term pain control. Patient will benefit from skilled PT services to address deficits and left knee pain in order to improve pain-free function at home and with work.    Personal Factors and Comorbidities Age;Past/Current Experience;Time since onset of injury/illness/exacerbation    Examination-Activity Limitations Locomotion Level;Sit;Stand    Examination-Participation Restrictions Community Activity;Occupation    Stability/Clinical Decision Making Stable/Uncomplicated    Rehab Potential Good    PT Frequency 2x / week    PT Duration 8 weeks    PT Treatment/Interventions ADLs/Self Care Home Management;Biofeedback;Aquatic Therapy;Canalith Repostioning;Cryotherapy;Electrical Stimulation;Iontophoresis 4mg /ml Dexamethasone;Moist Heat;Traction;Ultrasound;Contrast Bath;DME Instruction;Gait training;Stair training;Functional mobility training;Therapeutic activities;Therapeutic exercise;Balance training;Neuromuscular re-education;Patient/family education;Manual techniques;Passive range of motion;Dry needling;Vestibular;Spinal Manipulations;Joint Manipulations    PT Next Visit Plan LLE strengthening, LLE manual techniques, Review and modify HEP as needed    PT Home Exercise Plan None currently    Consulted and  Agree with Plan of Care Patient              Patient will benefit from skilled therapeutic intervention in order to improve the following deficits and impairments:  Decreased strength, Pain  Visit Diagnosis: Chronic pain of left knee  Muscle weakness (generalized)     Problem List Patient Active Problem List   Diagnosis Date Noted   Primary osteoarthritis of left knee 04/30/2021   05/02/2021 PT, DPT, GCS  Salaam Battershell 06/05/2021, 10:30 AM  [facility] [address] [place], [place], [place] Phone: [phone]   Fax:  [phone]  Name: Caitlin Cross MRN: Stana Bunting Date of Birth: 03-06-1966

## 2021-06-14 ENCOUNTER — Other Ambulatory Visit: Payer: Self-pay

## 2021-06-14 ENCOUNTER — Ambulatory Visit: Payer: BC Managed Care – PPO | Attending: Family Medicine | Admitting: Physical Therapy

## 2021-06-14 DIAGNOSIS — G8929 Other chronic pain: Secondary | ICD-10-CM | POA: Diagnosis present

## 2021-06-14 DIAGNOSIS — M6281 Muscle weakness (generalized): Secondary | ICD-10-CM | POA: Diagnosis present

## 2021-06-14 DIAGNOSIS — M25562 Pain in left knee: Secondary | ICD-10-CM | POA: Insufficient documentation

## 2021-06-14 NOTE — Therapy (Signed)
[facility] [address] [place], [place], [address] Phone: [phone]   Fax:  [phone]  Physical Therapy Treatment  Patient Details  Name: Caitlin Cross MRN: [id number] Date of Birth: Dec 13, 1965 Referring Provider (PT): Dr. Ashley Royalty   Encounter Date: 06/14/2021    Past Medical History:  Diagnosis Date   Anemia     Past Surgical History:  Procedure Laterality Date   CESAREAN SECTION     x 2    There were no vitals filed for this visit.   Subjective Assessment - 06/14/21 1534     Subjective Pt reports that she is doing good today, she is tired. She reports 2/10 left knee pain upon arrival. No significant changes since last therapy session. States she is not performing HEP.    Pertinent History Pt reports approximately 8 years of left knee pain localized to the anterior medial left knee with intermediate episodes of swelling. She gets occasional redness in the medial part of the left knee. 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 She uses ice and elevation to control her pain. She reports imaging of her L knee in 2019 and states that orthopedic surgery suggested knee surgery however she declined. She is unclear what type of knee surgery they were suggesting. She saw Dr. Ashley Royalty on 04/30/21 and plain film radiographs showed severe medial and moderate patellofemoral osteoarthritis. He prescribed meloxicam which has helped significantly. She does report mild GI upset since starting the medication. She also underwent an ultrasound-guided corticosteroid injection into her L knee and she reports approximately 40% improvement since the injection. She had never previously had a steroid injection in her L knee. MD is currently seeking coverage information for visco supplementation. Pt  states that she has tried glucosamine in the past which has been helpful. She has tried compressions stockings to help with the swelling as well as a knee brace. States she has been screened for systemic causes of pain and denies history of rheumatoid arthritis. Red flags negative.    Limitations Walking;Standing    How long can you sit comfortably? 30 minutes    How long can you stand comfortably? 30 minutes    How long can you walk comfortably? 30 minutes    Diagnostic tests See history    Patient Stated Goals Decrease left knee pain in order to complete her responsibilities at home and work             TREATMENT     Ther-ex  Seated elliptical Level 4.5-5.5 x 5 minutes for warm-up during history, with therapist adjusting resistance (2 minutes unbilled);   Total Gym L22 double leg squats with green tband around knees to prevent valgus and minimize knee pain 2 x 10; Total Gym L22 double leg heel raises 2 x 10;   L quad sets 2 x 10; L SLR with 3# ankle weight (AW) 2 x 10; L SAQ with 3# AW 2 x 10; Hooklying bridges staggerred stance with LLE back and RLE slightly extended 2 x 10; R side-lying L hip straight leg abduction with 3# AW 2 x 10; R side-lying L hip clams with manual resistance from therapist 2 x 10;     Manual Therapy  L knee tibia on femur AP and PA mobilizations, grade III, 30s/bout x 3 bouts each  for pain modulation; L patella mobs superior, inferior, medial, and lateral, 30 seconds/bout x2 bouts each direction; STM using Theraband roller to distal quad and medial L knee;       Pt educated throughout session about proper posture and technique with exercises. Improved exercise technique, movement at target joints, use of target muscles after min to mod verbal, visual, tactile cues.     Pt arrived with good motivation to today's PT session. Continued with functional and isolated muscle strengthening, maintained intensity with ankle weight and reps. She did report  pain in proximal quad/hip flexors during second set of SLR; 6 reps performed as pt reported she "can not do more." Continued with soft tissue mobilization and tibia on femur mobs for pain modulation. Will continue to progress loading of L knee for strengthening to improve long-term pain control. Patient will benefit from skilled PT services to address deficits and left knee pain in order to improve pain-free function at home and with work.        PT Short Term Goals - 05/23/21 0934       PT SHORT TERM GOAL #1   Title Pt will be independent with HEP in order to decrease knee pain and increase strength in order to improve pain-free function at home and work.    Time 4    Period Weeks    Status New    Target Date 06/18/21               PT Long Term Goals - 05/31/21 1751       PT LONG TERM GOAL #1   Title Pt will decrease worst pain as reported on NPRS by at least 3 points in order to demonstrate clinically significant reduction in ankle/foot pain.    Baseline 05/21/21: 10/10    Time 8    Period Weeks    Status New    Target Date 07/16/21      PT LONG TERM GOAL #2   Title Pt will increase FOTO to at least 72  in order to demonstrate significant improvement in lower extremity function.    Baseline 05/21/21: 59    Time 8    Period Weeks    Status New    Target Date 07/16/21      PT LONG TERM GOAL #3   Title Pt will increase LEFS by at least 9 points in order to demonstrate significant improvement in lower extremity function.    Baseline 05/21/21: To be completed at next visit; 05/28/21: 52/80    Time 8    Period Weeks    Status New    Target Date 07/16/21      PT LONG TERM GOAL #4   Title Pt will increase strength of L hip extension, abduction, and addution by at least 1/2 MMT grade in order to demonstrate improvement in strength and function as well as improved stability to L knee    Baseline 05/21/21: 4-/5 for all direction    Time 8    Period Weeks    Status New     Target Date 07/16/21                    Patient will benefit from skilled therapeutic intervention in order to improve the following deficits and impairments:     Visit Diagnosis: Chronic pain of left knee  Muscle weakness (generalized)     Problem List Patient Active Problem List   Diagnosis Date Noted   Primary  osteoarthritis of left knee 04/30/2021   Basilia Jumbo PT, DPT  Lavenia Atlas 06/14/2021, 3:35 PM  [facility] [address] [place], [place], [address] Phone: [phone]   Fax:  [phone]  Name: Gale Klar MRN: [id number] Date of Birth: 09-Sep-1966

## 2021-06-18 ENCOUNTER — Ambulatory Visit: Payer: BC Managed Care – PPO

## 2021-06-18 DIAGNOSIS — M6281 Muscle weakness (generalized): Secondary | ICD-10-CM

## 2021-06-18 DIAGNOSIS — M25562 Pain in left knee: Secondary | ICD-10-CM

## 2021-06-18 DIAGNOSIS — G8929 Other chronic pain: Secondary | ICD-10-CM

## 2021-06-18 NOTE — Therapy (Signed)
[facility] [address] [place], [place], [address] Phone: [phone]   Fax:  [phone]  Physical Therapy Treatment  Patient Details  Name: Caitlin Cross MRN: [id number] Date of Birth: 1966/03/06 Referring Provider (PT): Dr. Ashley Royalty   Encounter Date: 06/18/2021   PT End of Session - 06/18/21 1635     Visit Number 6    Number of Visits 17    Date for PT Re-Evaluation 07/16/21    Authorization Type eval: 05/21/21    PT Start Time 1630    PT Stop Time 1700    PT Time Calculation (min) 30 min    Activity Tolerance Patient tolerated treatment well    Behavior During Therapy [facility] for tasks assessed/performed             Past Medical History:  Diagnosis Date   Anemia     Past Surgical History:  Procedure Laterality Date   CESAREAN SECTION     x 2    There were no vitals filed for this visit.   Subjective Assessment - 06/18/21 1634     Subjective Pt reports that she is doing good today, tired today. She reports 2/10 left knee pain upon arrival. Overall she feels like she is improving with respect to her knee pain and it has improved approximately 30% since starting with therapy. No significant changes since last therapy session. States she is not performing HEP due to fatigue after work.    Pertinent History Pt reports approximately 8 years of left knee pain localized to the anterior medial left knee with intermediate episodes of swelling. She gets occasional redness in the medial part of the left knee. 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 She uses ice and elevation to control her pain. She reports imaging of her L knee in 2019 and states that orthopedic surgery suggested knee surgery however she declined. She is unclear what type of knee surgery they were suggesting.  She saw Dr. Ashley Royalty on 04/30/21 and plain film radiographs showed severe medial and moderate patellofemoral osteoarthritis. He prescribed meloxicam which has helped significantly. She does report mild GI upset since starting the medication. She also underwent an ultrasound-guided corticosteroid injection into her L knee and she reports approximately 40% improvement since the injection. She had never previously had a steroid injection in her L knee. MD is currently seeking coverage information for visco supplementation. Pt states that she has tried glucosamine in the past which has been helpful. She has tried compressions stockings to help with the swelling as well as a knee brace. States she has been screened for systemic causes of pain and denies history of rheumatoid arthritis. Red flags negative.    Limitations Walking;Standing    How long can you sit comfortably? 30 minutes    How long can you stand comfortably? 30 minutes    How long can you walk comfortably? 30 minutes    Diagnostic tests See history    Patient Stated Goals Decrease left knee pain in order to complete her responsibilities at home and work               TREATMENT     Ther-ex  Seated elliptical Level 4.5-5.5 x 5 minutes for warm-up during history, with therapist adjusting resistance (3 minutes unbilled);   Total Gym  L22 double leg squats with green tband around knees to prevent valgus and minimize knee pain 2 x 15; Total Gym L22 double leg heel raises 2 x 20; Total Gym L22 L single leg partial squats 2 x 10, no pain;  Standing L TKE quad set with blue tband 2 x 10; Standing BOSU (round side up) forward lunges with LLE forward 2 x 10;  L SLR with no resistance x 10, minimal L anterior hip pain, attempted light manual resistance however increases left anterior hip pain too much; R side-lying L hip straight leg abduction with manual resistance x10;  R side-lying L hip clams with manual resistance from therapist  x  10; R side-lying L hip reverse clams with manual resistance from therapist  x 10;        Pt educated throughout session about proper posture and technique with exercises. Improved exercise technique, movement at target joints, use of target muscles after min to mod verbal, visual, tactile cues.     Pt arrived with good motivation to today's PT session.  However she was late to appointment so session was abbreviated accordingly.  Continued with functional and isolated muscle strengthening.  Progressed Total Gym squats to left single-leg without any increase in pain today.  Will continue to progress loading of L knee for strengthening to improve long-term pain control. Patient will benefit from skilled PT services to address deficits and left knee pain in order to improve pain-free function at home and with work.        PT Short Term Goals - 05/23/21 0934       PT SHORT TERM GOAL #1   Title Pt will be independent with HEP in order to decrease knee pain and increase strength in order to improve pain-free function at home and work.    Time 4    Period Weeks    Status New    Target Date 06/18/21               PT Long Term Goals - 05/31/21 1751       PT LONG TERM GOAL #1   Title Pt will decrease worst pain as reported on NPRS by at least 3 points in order to demonstrate clinically significant reduction in ankle/foot pain.    Baseline 05/21/21: 10/10    Time 8    Period Weeks    Status New    Target Date 07/16/21      PT LONG TERM GOAL #2   Title Pt will increase FOTO to at least 72  in order to demonstrate significant improvement in lower extremity function.    Baseline 05/21/21: 59    Time 8    Period Weeks    Status New    Target Date 07/16/21      PT LONG TERM GOAL #3   Title Pt will increase LEFS by at least 9 points in order to demonstrate significant improvement in lower extremity function.    Baseline 05/21/21: To be completed at next visit; 05/28/21: 52/80    Time  8    Period Weeks    Status New    Target Date 07/16/21      PT LONG TERM GOAL #4   Title Pt will increase strength of L hip extension, abduction, and addution by at least 1/2 MMT grade in order to demonstrate improvement in strength and function as well as improved stability to L knee    Baseline 05/21/21: 4-/5 for all direction  Time 8    Period Weeks    Status New    Target Date 07/16/21                   Plan - 06/18/21 1636     Clinical Impression Statement Pt arrived with good motivation to today's PT session.  However she was late to appointment so session was abbreviated accordingly.  Continued with functional and isolated muscle strengthening.  Progressed Total Gym squats to left single-leg without any increase in pain today.  Will continue to progress loading of L knee for strengthening to improve long-term pain control. Patient will benefit from skilled PT services to address deficits and left knee pain in order to improve pain-free function at home and with work.    Personal Factors and Comorbidities Age;Past/Current Experience;Time since onset of injury/illness/exacerbation    Examination-Activity Limitations Locomotion Level;Sit;Stand    Examination-Participation Restrictions Community Activity;Occupation    Stability/Clinical Decision Making Stable/Uncomplicated    Rehab Potential Good    PT Frequency 2x / week    PT Duration 8 weeks    PT Treatment/Interventions ADLs/Self Care Home Management;Biofeedback;Aquatic Therapy;Canalith Repostioning;Cryotherapy;Electrical Stimulation;Iontophoresis 4mg /ml Dexamethasone;Moist Heat;Traction;Ultrasound;Contrast Bath;DME Instruction;Gait training;Stair training;Functional mobility training;Therapeutic activities;Therapeutic exercise;Balance training;Neuromuscular re-education;Patient/family education;Manual techniques;Passive range of motion;Dry needling;Vestibular;Spinal Manipulations;Joint Manipulations    PT Next Visit Plan  LLE strengthening, LLE manual techniques, Review and modify HEP as needed    PT Home Exercise Plan None currently    Consulted and Agree with Plan of Care Patient             Patient will benefit from skilled therapeutic intervention in order to improve the following deficits and impairments:  Decreased strength, Pain  Visit Diagnosis: Chronic pain of left knee  Muscle weakness (generalized)     Problem List Patient Active Problem List   Diagnosis Date Noted   Primary osteoarthritis of left knee 04/30/2021    05/02/2021 PT, DPT, GCS  Caitlin Cross 06/18/2021, 5:42 PM  [facility] [address] [place], [place], [place] Phone: [phone]   Fax:  [phone]  Name: Caitlin Cross MRN: Stana Bunting Date of Birth: 05-19-1966

## 2021-06-21 ENCOUNTER — Other Ambulatory Visit: Payer: Self-pay

## 2021-06-21 ENCOUNTER — Ambulatory Visit: Payer: BC Managed Care – PPO

## 2021-06-21 DIAGNOSIS — M6281 Muscle weakness (generalized): Secondary | ICD-10-CM

## 2021-06-21 DIAGNOSIS — M25562 Pain in left knee: Secondary | ICD-10-CM

## 2021-06-21 DIAGNOSIS — G8929 Other chronic pain: Secondary | ICD-10-CM

## 2021-06-21 NOTE — Therapy (Signed)
[place] [facility] [facility] [address]. [place], [place], [address] Phone: [phone]   Fax:  [phone]  Physical Therapy Treatment  Patient Details  Name: Caitlin Cross MRN: [id number] Date of Birth: Aug 02, 1966 Referring Provider (PT): Dr. Ashley Royalty   Encounter Date: 06/21/2021   PT End of Session - 06/21/21 1626     Visit Number 7    Number of Visits 17    Date for PT Re-Evaluation 07/16/21    Authorization Type eval: 05/21/21    PT Start Time 1615    PT Stop Time 1700    PT Time Calculation (min) 45 min    Activity Tolerance Patient tolerated treatment well    Behavior During Therapy [facility] for tasks assessed/performed             Past Medical History:  Diagnosis Date   Anemia     Past Surgical History:  Procedure Laterality Date   CESAREAN SECTION     x 2    There were no vitals filed for this visit.   Subjective Assessment - 06/21/21 1625     Subjective Pt reports that she is doing well today but tired. She denies any L knee pain upon arrival today. No significant changes since last therapy session.    Pertinent History Pt reports approximately 8 years of left knee pain localized to the anterior medial left knee with intermediate episodes of swelling. She gets occasional redness in the medial part of the left knee. 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 She uses ice and elevation to control her pain. She reports imaging of her L knee in 2019 and states that orthopedic surgery suggested knee surgery however she declined. She is unclear what type of knee surgery they were suggesting. She saw Dr. Ashley Royalty on 04/30/21 and plain film radiographs showed severe medial and moderate patellofemoral osteoarthritis. He prescribed meloxicam which has helped significantly. She does report  mild GI upset since starting the medication. She also underwent an ultrasound-guided corticosteroid injection into her L knee and she reports approximately 40% improvement since the injection. She had never previously had a steroid injection in her L knee. MD is currently seeking coverage information for visco supplementation. Pt states that she has tried glucosamine in the past which has been helpful. She has tried compressions stockings to help with the swelling as well as a knee brace. States she has been screened for systemic causes of pain and denies history of rheumatoid arthritis. Red flags negative.    Limitations Walking;Standing    How long can you sit comfortably? 30 minutes    How long can you stand comfortably? 30 minutes    How long can you walk comfortably? 30 minutes    Diagnostic tests See history    Patient Stated Goals Decrease left knee pain in order to complete her responsibilities at home and work               TREATMENT     Ther-ex  Total Gym L22 double leg squats with green tband around knees to prevent valgus and minimize knee pain 2 x 15; Total Gym L22 double leg heel raises 2 x 20; Total Gym L22 L single leg partial squats 2 x 10, no pain; Forward and L lateral 6" step-ups x 15 each; L single  leg R heel taps from 6" step x 10;  L SLR with no resistance x 10, minimal L anterior hip pain, attempted light manual resistance however increases left anterior hip pain too much; R side-lying L hip straight leg abduction with manual resistance x10;  R side-lying L hip clams with manual resistance from therapist  x 10; R side-lying L hip reverse clams with manual resistance from therapist  x 10;     Pt educated throughout session about proper posture and technique with exercises. Improved exercise technique, movement at target joints, use of target muscles after min to mod verbal, visual, tactile cues.     Pt arrived with good motivation to today's PT session.   Continued with functional and isolated muscle strengthening.  She denies any increase in left knee pain during session today.  Performed left single-leg step ups both forward and lateral without any increase in her pain.  She is able to perform left single-leg stance eccentric right heel taps but it is more challenging for patient.  Will continue to progress loading of L knee for strengthening to improve long-term pain control.  Patient continues to be noncompliant with HEP due to fatigue after working.  Patient will benefit from skilled PT services to address deficits and left knee pain in order to improve pain-free function at home and with work.        PT Short Term Goals - 05/23/21 0934       PT SHORT TERM GOAL #1   Title Pt will be independent with HEP in order to decrease knee pain and increase strength in order to improve pain-free function at home and work.    Time 4    Period Weeks    Status New    Target Date 06/18/21               PT Long Term Goals - 05/31/21 1751       PT LONG TERM GOAL #1   Title Pt will decrease worst pain as reported on NPRS by at least 3 points in order to demonstrate clinically significant reduction in ankle/foot pain.    Baseline 05/21/21: 10/10    Time 8    Period Weeks    Status New    Target Date 07/16/21      PT LONG TERM GOAL #2   Title Pt will increase FOTO to at least 72  in order to demonstrate significant improvement in lower extremity function.    Baseline 05/21/21: 59    Time 8    Period Weeks    Status New    Target Date 07/16/21      PT LONG TERM GOAL #3   Title Pt will increase LEFS by at least 9 points in order to demonstrate significant improvement in lower extremity function.    Baseline 05/21/21: To be completed at next visit; 05/28/21: 52/80    Time 8    Period Weeks    Status New    Target Date 07/16/21      PT LONG TERM GOAL #4   Title Pt will increase strength of L hip extension, abduction, and addution by at  least 1/2 MMT grade in order to demonstrate improvement in strength and function as well as improved stability to L knee    Baseline 05/21/21: 4-/5 for all direction    Time 8    Period Weeks    Status New    Target Date 07/16/21  Plan - 06/21/21 1626     Clinical Impression Statement Pt arrived with good motivation to today's PT session.  Continued with functional and isolated muscle strengthening.  She denies any increase in left knee pain during session today.  Performed left single-leg step ups both forward and lateral without any increase in her pain.  She is able to perform left single-leg stance eccentric right heel taps but it is more challenging for patient.  Will continue to progress loading of L knee for strengthening to improve long-term pain control.  Patient continues to be noncompliant with HEP due to fatigue after working.  Patient will benefit from skilled PT services to address deficits and left knee pain in order to improve pain-free function at home and with work.    Personal Factors and Comorbidities Age;Past/Current Experience;Time since onset of injury/illness/exacerbation    Examination-Activity Limitations Locomotion Level;Sit;Stand    Examination-Participation Restrictions Community Activity;Occupation    Stability/Clinical Decision Making Stable/Uncomplicated    Rehab Potential Good    PT Frequency 2x / week    PT Duration 8 weeks    PT Treatment/Interventions ADLs/Self Care Home Management;Biofeedback;Aquatic Therapy;Canalith Repostioning;Cryotherapy;Electrical Stimulation;Iontophoresis 4mg /ml Dexamethasone;Moist Heat;Traction;Ultrasound;Contrast Bath;DME Instruction;Gait training;Stair training;Functional mobility training;Therapeutic activities;Therapeutic exercise;Balance training;Neuromuscular re-education;Patient/family education;Manual techniques;Passive range of motion;Dry needling;Vestibular;Spinal Manipulations;Joint Manipulations     PT Next Visit Plan LLE strengthening, LLE manual techniques, Review and modify HEP as needed    PT Home Exercise Plan Access Code: [email or web address]    Consulted and Agree with Plan of Care Patient             Patient will benefit from skilled therapeutic intervention in order to improve the following deficits and impairments:  Decreased strength, Pain  Visit Diagnosis: Chronic pain of left knee  Muscle weakness (generalized)     Problem List Patient Active Problem List   Diagnosis Date Noted   Primary osteoarthritis of left knee 04/30/2021    05/02/2021 Bluford Sedler PT, DPT, GCS  Farhad Burleson 06/22/2021, 1:36 PM  [place] [address] [place], [place], [place] Phone: [phone]   Fax:  [phone]  Name: Caitlin Cross MRN: Stana Bunting Date of Birth: February 15, 1966

## 2021-06-25 ENCOUNTER — Ambulatory Visit: Payer: BC Managed Care – PPO

## 2021-07-02 ENCOUNTER — Ambulatory Visit: Payer: BC Managed Care – PPO

## 2021-07-05 ENCOUNTER — Ambulatory Visit: Payer: BC Managed Care – PPO

## 2021-07-05 ENCOUNTER — Other Ambulatory Visit: Payer: Self-pay

## 2021-08-13 ENCOUNTER — Other Ambulatory Visit: Payer: Self-pay

## 2021-08-13 ENCOUNTER — Ambulatory Visit
Admission: EM | Admit: 2021-08-13 | Discharge: 2021-08-13 | Disposition: A | Discharge status: Home / Self Care | Payer: BC Managed Care – PPO | Attending: Emergency Medicine | Admitting: Emergency Medicine

## 2021-08-13 ENCOUNTER — Ambulatory Visit: Payer: Self-pay | Admitting: *Deleted

## 2021-08-13 DIAGNOSIS — R202 Paresthesia of skin: Secondary | ICD-10-CM

## 2021-08-13 NOTE — Telephone Encounter (Signed)
Noted  KP 

## 2021-08-13 NOTE — Discharge Instructions (Addendum)
The source of your numbness is unclear.  I have made a referral to [facility] neurology for further evaluation.  They will call you to make an appointment.  Please return for reevaluation for new or worsening symptoms.

## 2021-08-13 NOTE — Telephone Encounter (Signed)
Reason for Disposition  [1] Tingling (e.g., pins and needles) of the face, arm / hand, or leg / foot on one side of the body AND [2] present now (Exceptions: chronic/recurrent symptom lasting > 4 weeks or tingling from known cause, such as: bumped elbow, carpal tunnel syndrome, pinched nerve, frostbite)  Answer Assessment - Initial Assessment Questions 1. SYMPTOM: "What is the main symptom you are concerned about?" (e.g., weakness, numbness)     Right arm numbness, tingling, cold to touch 2. ONSET: "When did this start?" (minutes, hours, days; while sleeping)     This AM. 1030 am 3. LAST NORMAL: "When was the last time you (the patient) were normal (no symptoms)?"     This AM 1030 4. PATTERN "Does this come and go, or has it been constant since it started?"  "Is it present now?"     Comes and goes 5. CARDIAC SYMPTOMS: "Have you had any of the following symptoms: chest pain, difficulty breathing, palpitations?"     no 6. NEUROLOGIC SYMPTOMS: "Have you had any of the following symptoms: headache, dizziness, vision loss, double vision, changes in speech, unsteady on your feet?"     none 7. OTHER SYMPTOMS: "Do you have any other symptoms?"     Cool to touch  Protocols used: Neurologic Deficit-A-AH

## 2021-08-13 NOTE — Telephone Encounter (Signed)
Pt reports tingling of right arm, some numbness, onset this AM 1030. Also reports cool to touch. Denies pain, no swelling. States tingling comes and goes, Does not recall any injury or overuse, no repetitive movements. States she feels "Unusual." Denies any other symptoms. Disposition to be seen within 4hrs or ED/UC. NT called practice, Amanda, no availability,advised ED/ UC. Pt verbalizes understanding.   Assisted by Interpreter [place] # [phone]

## 2021-08-13 NOTE — ED Provider Notes (Signed)
[facility]    CSN: [id number] Arrival date & time: 08/13/21  1416      History   Chief Complaint Chief Complaint  Patient presents with   Numbness    HPI Caitlin Cross is a 55 y.o. female.   HPI  55 year old female here for evaluation of right arm numbness.  Patient reports that she was eating lunch at about 1010 this morning when she developed numbness to her right forearm.  The numbness extends from her wrist to her elbow and is only on the volar aspect.  It will come and go and lasts 10 to 15 seconds each time.  She states she feels like she has weakness in her hand and that her fingers become cold when she is having the numbness.  She denies any previous episodes similar to this, headache, or dizziness.  She has no personal history of diabetes.  She does not take any supplements and she denies any new medications.  Past Medical History:  Diagnosis Date   Anemia     Patient Active Problem List   Diagnosis Date Noted   Primary osteoarthritis of left knee 04/30/2021    Past Surgical History:  Procedure Laterality Date   CESAREAN SECTION     x 2    OB History     Gravida  4   Para  4   Term      Preterm      AB      Living  4      SAB      IAB      Ectopic      Multiple  1   Live Births               Home Medications    Prior to Admission medications   Medication Sig Start Date End Date Taking? Authorizing Provider  Ascorbic Acid (VITAMIN C) 1000 MG tablet Take 2,000 mg by mouth daily.    [provider]  meloxicam (MOBIC) 15 MG tablet Take 1 tablet (15 mg total) by mouth daily. 04/18/21   Duanne Limerick, MD  Multiple Vitamins-Minerals (WOMENS MULTIVITAMIN PO) Take 1 tablet by mouth daily.    [provider]  Omega-3 Fatty Acids (FISH OIL) 1000 MG CAPS Take 1 capsule by mouth daily.    [provider]  rosuvastatin (CRESTOR) 10 MG tablet Take 1 tablet (10 mg total) by mouth daily. 04/20/21    Duanne Limerick, MD    Family History Family History  Problem Relation Age of Onset   Diabetes Mother    Heart disease Father    Stroke Father    Diabetes Sister    Diabetes Brother    Diabetes Maternal Grandmother     Social History Social History   Tobacco Use   Smoking status: Former   Smokeless tobacco: Never  Building services engineer Use: Never used  Substance Use Topics   Alcohol use: Yes   Drug use: Never     Allergies   Patient has no known allergies.   Review of Systems Review of Systems  Constitutional:  Negative for activity change, appetite change and fever.  Musculoskeletal:  Negative for arthralgias, myalgias and neck pain.  Skin:  Negative for color change and wound.  Neurological:  Negative for dizziness, facial asymmetry, weakness, light-headedness, numbness and headaches.  Hematological: Negative.   Psychiatric/Behavioral: Negative.      Physical Exam Triage Vital Signs ED Triage Vitals  Enc  Vitals Group     BP 08/13/21 1504 117/85     Pulse Rate 08/13/21 1504 73     Resp 08/13/21 1504 19     Temp 08/13/21 1504 98 F (36.7 C)     Temp src --      SpO2 08/13/21 1504 100 %     Weight --      Height --      Head Circumference --      Peak Flow --      Pain Score 08/13/21 1503 0     Pain Loc --      Pain Edu? --      Excl. in GC? --    No data found.  Updated Vital Signs BP 117/85   Pulse 73   Temp 98 F (36.7 C)   Resp 19   SpO2 100%   Visual Acuity Right Eye Distance:   Left Eye Distance:   Bilateral Distance:    Right Eye Near:   Left Eye Near:    Bilateral Near:     Physical Exam Vitals and nursing note reviewed.  Constitutional:      General: She is not in acute distress.    Appearance: Normal appearance. She is not ill-appearing.  HENT:     Head: Normocephalic and atraumatic.     Right Ear: Tympanic membrane, ear canal and external ear normal. There is no impacted cerumen.     Left Ear: Tympanic membrane, ear  canal and external ear normal. There is no impacted cerumen.     Mouth/Throat:     Mouth: Mucous membranes are moist.     Pharynx: Oropharynx is clear. No oropharyngeal exudate or posterior oropharyngeal erythema.  Eyes:     General: No scleral icterus.       Right eye: No discharge.        Left eye: No discharge.     Extraocular Movements: Extraocular movements intact.     Conjunctiva/sclera: Conjunctivae normal.     Pupils: Pupils are equal, round, and reactive to light.  Skin:    General: Skin is warm and dry.     Capillary Refill: Capillary refill takes less than 2 seconds.  Neurological:     General: No focal deficit present.     Mental Status: She is alert and oriented to person, place, and time.     Cranial Nerves: No cranial nerve deficit.     Sensory: No sensory deficit.     Motor: No weakness.     Coordination: Coordination normal.     Gait: Gait normal.     Deep Tendon Reflexes: Reflexes normal.  Psychiatric:        Mood and Affect: Mood normal.        Behavior: Behavior normal.        Thought Content: Thought content normal.        Judgment: Judgment normal.     UC Treatments / Results  Labs (all labs ordered are listed, but only abnormal results are displayed) Labs Reviewed - No data to display  EKG   Radiology No results found.  Procedures Procedures (including critical care time)  Medications Ordered in UC Medications - No data to display  Initial Impression / Assessment and Plan / UC Course  I have reviewed the triage vital signs and the nursing notes.  Pertinent labs & imaging results that were available during my care of the patient were reviewed by me and considered in my medical decision  making (see chart for details).  Patient is a nontoxic-appearing 55 year old female here for evaluation of sudden onset of right arm paresthesias that are transient and come and go at random points.  They typically last 10 to 15 seconds in duration.  The  patient states she feels like she has weakness in her hands when they come on and also that her fingers get colder.  That resolves when the numbness resolves.  Patient has contacted her primary care provider and she was referred to the urgent care for evaluation.  Patient's physical exam reveals protegrin tympanic membranes bilaterally with normal right reflex and clear external auditory canals.  Pupils are equal round and reactive and EOMs intact.  Cranial nerves II through XII are intact.  Her mucous membranes are pink and moist.  Patient's bilateral grips and upper extremity strength are 5/5.  Bilateral lower extremity strength is 5/5.  DTRs are 2+ globally.  Radial and ulnar pulses are 2+.  Cap refills less than 2 seconds.  Patient denies any extension of numbness or tingling into her fingers.  Negative Tinel sign.  Is unclear as to what is causing the patient's paresthesias.  We had a discussion that it could be vitamin related, related to sugar, or idiopathic.  As patient is never had this before and I cannot find any abnormal neuro findings I believe this is probably idiopathic cause.  Patient is concerned because she is due to go on a cruise on Friday and she wants to know if there is any medication she can take to improve her symptoms.  Will refer patient back to her primary care provider and neurology for further evaluation.   Final Clinical Impressions(s) / UC Diagnoses   Final diagnoses:  Paresthesias     Discharge Instructions      The source of your numbness is unclear.  I have made a referral to [facility] neurology for further evaluation.  They will call you to make an appointment.  Please return for reevaluation for new or worsening symptoms.     ED Prescriptions   None    PDMP not reviewed this encounter.   Becky Augusta, NP 08/13/21 1540

## 2021-08-13 NOTE — ED Triage Notes (Signed)
Pt presents with complaints of sudden onset of numbness and tingling in her right arm that started today at 1010am. Reports it lasted for a few seconds. Denies that feeling currently. No neuro deficits during intake.

## 2021-09-18 ENCOUNTER — Ambulatory Visit
Admission: RE | Admit: 2021-09-18 | Discharge: 2021-09-18 | Disposition: A | Discharge status: Home / Self Care | Payer: BC Managed Care – PPO | Source: Ambulatory Visit | Attending: Physician Assistant | Admitting: Physician Assistant

## 2021-09-18 ENCOUNTER — Other Ambulatory Visit: Payer: Self-pay | Admitting: Physician Assistant

## 2021-09-18 ENCOUNTER — Ambulatory Visit
Admission: RE | Admit: 2021-09-18 | Discharge: 2021-09-18 | Disposition: A | Discharge status: Home / Self Care | Payer: Worker's Compensation | Source: Ambulatory Visit | Attending: Physician Assistant | Admitting: Physician Assistant

## 2021-09-18 DIAGNOSIS — M25461 Effusion, right knee: Secondary | ICD-10-CM | POA: Diagnosis not present

## 2021-09-18 DIAGNOSIS — M25561 Pain in right knee: Secondary | ICD-10-CM | POA: Diagnosis not present

## 2021-09-18 DIAGNOSIS — W19XXXA Unspecified fall, initial encounter: Secondary | ICD-10-CM | POA: Diagnosis not present

## 2021-09-18 DIAGNOSIS — M1711 Unilateral primary osteoarthritis, right knee: Secondary | ICD-10-CM | POA: Diagnosis not present

## 2021-09-18 DIAGNOSIS — Z043 Encounter for examination and observation following other accident: Secondary | ICD-10-CM | POA: Diagnosis not present

## 2021-09-18 DIAGNOSIS — S8991XA Unspecified injury of right lower leg, initial encounter: Secondary | ICD-10-CM | POA: Diagnosis not present

## 2021-09-27 ENCOUNTER — Ambulatory Visit: Payer: BC Managed Care – PPO | Admitting: Neurology

## 2021-10-12 ENCOUNTER — Other Ambulatory Visit: Payer: Self-pay

## 2021-10-12 ENCOUNTER — Encounter: Payer: Self-pay | Admitting: Emergency Medicine

## 2021-10-12 ENCOUNTER — Ambulatory Visit
Admission: EM | Admit: 2021-10-12 | Discharge: 2021-10-12 | Disposition: A | Discharge status: Home / Self Care | Payer: BC Managed Care – PPO | Attending: Emergency Medicine | Admitting: Emergency Medicine

## 2021-10-12 ENCOUNTER — Ambulatory Visit: Payer: Self-pay

## 2021-10-12 DIAGNOSIS — J0100 Acute maxillary sinusitis, unspecified: Secondary | ICD-10-CM | POA: Diagnosis not present

## 2021-10-12 MED ORDER — AMOXICILLIN 875 MG PO TABS: 875.0000 mg | ORAL_TABLET | Freq: Two times a day (BID) | ORAL | 0 refills | Status: AC

## 2021-10-12 NOTE — Discharge Instructions (Addendum)
Take the amoxicillin as directed for your sinus infection.    Your COVID and Flu tests are pending.  You should self quarantine until the test results are back.    Take Tylenol or ibuprofen as needed for fever or discomfort.  Rest and keep yourself hydrated.    Follow-up with your primary care provider if your symptoms are not improving.

## 2021-10-12 NOTE — ED Triage Notes (Signed)
Pt here with flu-like sx x 8 days. Sx are badenough that she cannot cook, clean, work, or drive. States she is exhausted, has right ear pain and right eye pain and drainage. Inquires about FMLA paperwork being filled out, but we explained that that is not a service we provide.

## 2021-10-12 NOTE — ED Provider Notes (Signed)
Caitlin Cross    CSN: [id number] Arrival date & time: 10/12/21  1122      History   Chief Complaint Chief Complaint  Patient presents with   Cough   Nasal Congestion   Generalized Body Aches   Fatigue   Eye Problem    HPI Caitlin Cross is a 55 y.o. female.  Patient presents with 8-day history of fever, chills, body aches, fatigue, ear ache, congestion, cough.  She reports clear drainage from her right eye this morning; no trauma.  She denies rash, shortness of breath, vomiting, diarrhea, or other symptoms.  Several OTC cold and flu medications attempted since onset.  Her medical history includes anemia and osteoarthritis.  The history is provided by the patient. A language interpreter was used.   Past Medical History:  Diagnosis Date   Anemia     Patient Active Problem List   Diagnosis Date Noted   Primary osteoarthritis of left knee 04/30/2021    Past Surgical History:  Procedure Laterality Date   CESAREAN SECTION     x 2    OB History     Gravida  4   Para  4   Term      Preterm      AB      Living  4      SAB      IAB      Ectopic      Multiple  1   Live Births               Home Medications    Prior to Admission medications   Medication Sig Start Date End Date Taking? Authorizing Provider  amoxicillin (AMOXIL) 875 MG tablet Take 1 tablet (875 mg total) by mouth 2 (two) times daily for 10 days. 10/12/21 10/22/21 Yes Mickie Bail, NP  Ascorbic Acid (VITAMIN C) 1000 MG tablet Take 2,000 mg by mouth daily.    [provider]  meloxicam (MOBIC) 15 MG tablet Take 1 tablet (15 mg total) by mouth daily. 04/18/21   Duanne Limerick, MD  Multiple Vitamins-Minerals (WOMENS MULTIVITAMIN PO) Take 1 tablet by mouth daily.    [provider]  Omega-3 Fatty Acids (FISH OIL) 1000 MG CAPS Take 1 capsule by mouth daily.    [provider]  rosuvastatin (CRESTOR) 10 MG tablet Take 1 tablet (10 mg total) by mouth  daily. 04/20/21   Duanne Limerick, MD    Family History Family History  Problem Relation Age of Onset   Diabetes Mother    Heart disease Father    Stroke Father    Diabetes Sister    Diabetes Brother    Diabetes Maternal Grandmother     Social History Social History   Tobacco Use   Smoking status: Former   Smokeless tobacco: Never  Building services engineer Use: Never used  Substance Use Topics   Alcohol use: Yes   Drug use: Never     Allergies   Patient has no known allergies.   Review of Systems Review of Systems  Constitutional:  Positive for chills, fatigue and fever.  HENT:  Positive for congestion and ear pain. Negative for sore throat.   Eyes:  Positive for discharge. Negative for pain, redness, itching and visual disturbance.  Respiratory:  Positive for cough. Negative for shortness of breath.   Cardiovascular:  Negative for chest pain and palpitations.  Gastrointestinal:  Negative for diarrhea and vomiting.  Skin:  Negative  for color change and rash.  All other systems reviewed and are negative.   Physical Exam Triage Vital Signs ED Triage Vitals  Enc Vitals Group     BP      Pulse      Resp      Temp      Temp src      SpO2      Weight      Height      Head Circumference      Peak Flow      Pain Score      Pain Loc      Pain Edu?      Excl. in [place]?    No data found.  Updated Vital Signs BP 107/72   Pulse 82   Temp 99.1 F (37.3 C)   Resp 20   SpO2 98%   Visual Acuity Right Eye Distance:   Left Eye Distance:   Bilateral Distance:    Right Eye Near:   Left Eye Near:    Bilateral Near:     Physical Exam Vitals and nursing note reviewed.  Constitutional:      General: She is not in acute distress.    Appearance: She is well-developed.  HENT:     Head: Normocephalic and atraumatic.     Right Ear: Tympanic membrane normal.     Left Ear: Tympanic membrane normal.     Nose: Congestion present.     Mouth/Throat:     Mouth: Mucous  membranes are moist.     Pharynx: Oropharynx is clear.  Cardiovascular:     Rate and Rhythm: Normal rate and regular rhythm.     Heart sounds: Normal heart sounds.  Pulmonary:     Effort: Pulmonary effort is normal. No respiratory distress.     Breath sounds: Normal breath sounds.  Abdominal:     Palpations: Abdomen is soft.     Tenderness: There is no abdominal tenderness.  Musculoskeletal:     Cervical back: Neck supple.  Skin:    General: Skin is warm and dry.  Neurological:     Mental Status: She is alert.  Psychiatric:        Mood and Affect: Mood normal.        Behavior: Behavior normal.     UC Treatments / Results  Labs (all labs ordered are listed, but only abnormal results are displayed) Labs Reviewed  COVID-19, FLU A+B NAA    EKG   Radiology No results found.  Procedures Procedures (including critical care time)  Medications Ordered in UC Medications - No data to display  Initial Impression / Assessment and Plan / UC Course  I have reviewed the triage vital signs and the nursing notes.  Pertinent labs & imaging results that were available during my care of the patient were reviewed by me and considered in my medical decision making (see chart for details).   Acute sinusitis.  Patient has been symptomatic for 8 days.  Treating with amoxicillin.  Per patient request, COVID and Flu pending.  Instructed patient to self quarantine per CDC guidelines.  Discussed symptomatic treatment including Tylenol or ibuprofen, rest, hydration.  Instructed patient to follow up with PCP if symptoms are not improving.  Patient agrees to plan of care.    Final Clinical Impressions(s) / UC Diagnoses   Final diagnoses:  Acute non-recurrent maxillary sinusitis     Discharge Instructions      Take the amoxicillin as directed for your  sinus infection.    Your COVID and Flu tests are pending.  You should self quarantine until the test results are back.    Take Tylenol  or ibuprofen as needed for fever or discomfort.  Rest and keep yourself hydrated.    Follow-up with your primary care provider if your symptoms are not improving.         ED Prescriptions     Medication Sig Dispense Auth. Provider   amoxicillin (AMOXIL) 875 MG tablet Take 1 tablet (875 mg total) by mouth 2 (two) times daily for 10 days. 20 tablet Sharion Balloon, NP      PDMP not reviewed this encounter.   Sharion Balloon, NP 10/12/21 1306

## 2021-10-12 NOTE — Telephone Encounter (Signed)
Pt. Started having symptoms last Thursday. Fever, body aches,chills, mucus from right eye. Has taken Dayquil and Nyquil. Asking for in office visit. Please advise. Used Public relations account executive # [phone].     Answer Assessment - Initial Assessment Questions 1. TEMPERATURE: "What is the most recent temperature?"  "How was it measured?"      Unsure 2. ONSET: "When did the fever start?"      Last Thursday 3. CHILLS: "Do you have chills?" If yes: "How bad are they?"  (e.g., none, mild, moderate, severe)   - NONE: no chills   - MILD: feeling cold   - MODERATE: feeling very cold, some shivering (feels better under a thick blanket)   - SEVERE: feeling extremely cold with shaking chills (general body shaking, rigors; even under a thick blanket)      Mild 4. OTHER SYMPTOMS: "Do you have any other symptoms besides the fever?"  (e.g., abdomen pain, cough, diarrhea, earache, headache, sore throat, urination pain)     Chills, body aches, runny nose, mucus right eye 5. CAUSE: If there are no symptoms, ask: "What do you think is causing the fever?"      Unsure 6. CONTACTS: "Does anyone else in the family have an infection?"     No 7. TREATMENT: "What have you done so far to treat this fever?" (e.g., medications)     Dayquil, Nyquil 8. IMMUNOCOMPROMISE: "Do you have of the following: diabetes, HIV positive, splenectomy, cancer chemotherapy, chronic steroid treatment, transplant patient, etc."     NO 9. PREGNANCY: "Is there any chance you are pregnant?" "When was your last menstrual period?"     No 10. TRAVEL: "Have you traveled out of the country in the last month?" (e.g., travel history, exposures)       No  Protocols used: [facility]

## 2021-10-13 LAB — COVID-19, FLU A+B NAA
Influenza A, NAA: NOT DETECTED
Influenza B, NAA: NOT DETECTED
SARS-CoV-2, NAA: NOT DETECTED

## 2021-10-16 ENCOUNTER — Other Ambulatory Visit
Admission: RE | Admit: 2021-10-16 | Discharge: 2021-10-16 | Disposition: A | Discharge status: Home / Self Care | Payer: BC Managed Care – PPO | Source: Home / Self Care | Attending: Family Medicine | Admitting: Family Medicine

## 2021-10-16 ENCOUNTER — Ambulatory Visit
Admission: RE | Admit: 2021-10-16 | Discharge: 2021-10-16 | Disposition: A | Discharge status: Home / Self Care | Payer: BC Managed Care – PPO | Source: Ambulatory Visit | Attending: Family Medicine | Admitting: Family Medicine

## 2021-10-16 ENCOUNTER — Ambulatory Visit (INDEPENDENT_AMBULATORY_CARE_PROVIDER_SITE_OTHER): Payer: BC Managed Care – PPO | Admitting: Family Medicine

## 2021-10-16 ENCOUNTER — Encounter: Payer: Self-pay | Admitting: Family Medicine

## 2021-10-16 ENCOUNTER — Ambulatory Visit
Admission: RE | Admit: 2021-10-16 | Discharge: 2021-10-16 | Disposition: A | Discharge status: Home / Self Care | Payer: BC Managed Care – PPO | Attending: Family Medicine | Admitting: Family Medicine

## 2021-10-16 ENCOUNTER — Other Ambulatory Visit: Payer: Self-pay

## 2021-10-16 VITALS — BP 110/80 | HR 80 | Temp 98.6°F | Ht 66.0 in | Wt 200.0 lb

## 2021-10-16 DIAGNOSIS — R051 Acute cough: Secondary | ICD-10-CM | POA: Insufficient documentation

## 2021-10-16 DIAGNOSIS — J0100 Acute maxillary sinusitis, unspecified: Secondary | ICD-10-CM

## 2021-10-16 DIAGNOSIS — R5383 Other fatigue: Secondary | ICD-10-CM

## 2021-10-16 DIAGNOSIS — R059 Cough, unspecified: Secondary | ICD-10-CM | POA: Diagnosis not present

## 2021-10-16 LAB — CBC WITH DIFFERENTIAL/PLATELET
Abs Immature Granulocytes: 0.03 10*3/uL (ref 0.00–0.07)
Basophils Absolute: 0.1 10*3/uL (ref 0.0–0.1)
Basophils Relative: 1 %
Eosinophils Absolute: 0.2 10*3/uL (ref 0.0–0.5)
Eosinophils Relative: 3 %
HCT: 45.5 % (ref 36.0–46.0)
Hemoglobin: 14.7 g/dL (ref 12.0–15.0)
Immature Granulocytes: 0 %
Lymphocytes Relative: 27 %
Lymphs Abs: 2.1 10*3/uL (ref 0.7–4.0)
MCH: 26.8 pg (ref 26.0–34.0)
MCHC: 32.3 g/dL (ref 30.0–36.0)
MCV: 82.9 fL (ref 80.0–100.0)
Monocytes Absolute: 0.5 10*3/uL (ref 0.1–1.0)
Monocytes Relative: 6 %
Neutro Abs: 5.1 10*3/uL (ref 1.7–7.7)
Neutrophils Relative %: 63 %
Platelets: 311 10*3/uL (ref 150–400)
RBC: 5.49 MIL/uL — ABNORMAL HIGH (ref 3.87–5.11)
RDW: 13 % (ref 11.5–15.5)
WBC: 8 10*3/uL (ref 4.0–10.5)
nRBC: 0 % (ref 0.0–0.2)

## 2021-10-16 LAB — MONONUCLEOSIS SCREEN: Mono Screen: NEGATIVE

## 2021-10-16 NOTE — Progress Notes (Signed)
Date:  10/16/2021   Name:  Caitlin Cross   DOB:  May 13, 1966   MRN:  [id number]   Chief Complaint: Sinusitis (Started Augmentin on 12/2- has been on for 4 days- having bloody drainage from nose, feels fatigued)  Sinusitis This is a new problem. The current episode started in the past 7 days. The problem has been waxing and waning since onset. There has been no fever. The pain is moderate. Associated symptoms include congestion, coughing, headaches, shortness of breath, sinus pressure and a sore throat. Pertinent negatives include no chills, diaphoresis, ear pain, hoarse voice, neck pain, sneezing or swollen glands. Past treatments include antibiotics. The treatment provided no relief.   Lab Results  Component Value Date   NA 143 04/18/2021   K 4.3 04/18/2021   CO2 28 04/18/2021   GLUCOSE 98 04/18/2021   BUN 14 04/18/2021   CREATININE 0.67 04/18/2021   CALCIUM 9.5 04/18/2021   EGFR 103 04/18/2021   GFRNONAA 100 03/02/2020   Lab Results  Component Value Date   CHOL 323 (H) 04/18/2021   HDL 55 04/18/2021   LDLCALC 248 (H) 04/18/2021   TRIG 112 04/18/2021   No results found for: TSH No results found for: HGBA1C No results found for: WBC, HGB, HCT, MCV, PLT No results found for: ALT, AST, GGT, ALKPHOS, BILITOT No results found for: 25OHVITD2, 25OHVITD3, VD25OH   Review of Systems  Constitutional:  Negative for chills and diaphoresis.  HENT:  Positive for congestion, sinus pressure and sore throat. Negative for ear pain, hoarse voice and sneezing.   Respiratory:  Positive for cough and shortness of breath.   Musculoskeletal:  Negative for neck pain.  Neurological:  Positive for headaches.   Patient Active Problem List   Diagnosis Date Noted   Primary osteoarthritis of left knee 04/30/2021    No Known Allergies  Past Surgical History:  Procedure Laterality Date   CESAREAN SECTION     x 2    Social History   Tobacco Use   Smoking status: Former   Smokeless  tobacco: Never  Vaping Use   Vaping Use: Never used  Substance Use Topics   Alcohol use: Yes   Drug use: Never     Medication list has been reviewed and updated.  Current Meds  Medication Sig   amoxicillin (AMOXIL) 875 MG tablet Take 1 tablet (875 mg total) by mouth 2 (two) times daily for 10 days.   Ascorbic Acid (VITAMIN C) 1000 MG tablet Take 2,000 mg by mouth daily.   meloxicam (MOBIC) 15 MG tablet Take 1 tablet (15 mg total) by mouth daily.   Multiple Vitamins-Minerals (WOMENS MULTIVITAMIN PO) Take 1 tablet by mouth daily.   Omega-3 Fatty Acids (FISH OIL) 1000 MG CAPS Take 1 capsule by mouth daily.   rosuvastatin (CRESTOR) 10 MG tablet Take 1 tablet (10 mg total) by mouth daily.    PHQ 2/9 Scores 04/30/2021 04/18/2021 03/02/2020  PHQ - 2 Score 0 0 0  PHQ- 9 Score 1 0 0    GAD 7 : Generalized Anxiety Score 04/30/2021 04/18/2021 03/02/2020  Nervous, Anxious, on Edge 0 0 0  Control/stop worrying 0 0 0  Worry too much - different things 0 0 0  Trouble relaxing 0 0 0  Restless 0 0 0  Easily annoyed or irritable 0 0 0  Afraid - awful might happen 0 0 0  Total GAD 7 Score 0 0 0  Anxiety Difficulty Not difficult at all - -  BP Readings from Last 3 Encounters:  10/16/21 110/80  10/12/21 107/72  08/13/21 117/85    Physical Exam Vitals and nursing note reviewed.  Constitutional:      General: She is not in acute distress.    Appearance: She is not diaphoretic.  HENT:     Head: Normocephalic and atraumatic.     Right Ear: Tympanic membrane, ear canal and external ear normal. There is no impacted cerumen.     Left Ear: Tympanic membrane, ear canal and external ear normal. There is no impacted cerumen.     Nose: Nose normal. No congestion or rhinorrhea.     Right Turbinates: Swollen.     Left Turbinates: Swollen.     Right Sinus: No maxillary sinus tenderness or frontal sinus tenderness.     Left Sinus: No maxillary sinus tenderness or frontal sinus tenderness.  Eyes:      General:        Right eye: No discharge.        Left eye: No discharge.     Conjunctiva/sclera: Conjunctivae normal.     Pupils: Pupils are equal, round, and reactive to light.  Neck:     Thyroid: No thyromegaly.     Vascular: No JVD.  Cardiovascular:     Rate and Rhythm: Normal rate and regular rhythm.     Heart sounds: Normal heart sounds. No murmur heard.   No friction rub. No gallop.  Pulmonary:     Effort: Pulmonary effort is normal.     Breath sounds: Normal breath sounds. No wheezing or rhonchi.  Abdominal:     General: Bowel sounds are normal.     Palpations: Abdomen is soft. There is no mass.     Tenderness: There is no abdominal tenderness. There is no guarding.  Musculoskeletal:        General: Normal range of motion.     Cervical back: Normal range of motion and neck supple.  Lymphadenopathy:     Cervical: Cervical adenopathy present.     Right cervical: Superficial cervical adenopathy present.     Left cervical: Superficial cervical adenopathy present.  Skin:    General: Skin is warm and dry.  Neurological:     Mental Status: She is alert.    Wt Readings from Last 3 Encounters:  10/16/21 200 lb (90.7 kg)  04/30/21 205 lb (93 kg)  04/18/21 205 lb (93 kg)    BP 110/80   Pulse 80   Temp 98.6 F (37 C) (Oral)   Ht 5' 6"  (1.676 m)   Wt 200 lb (90.7 kg)   SpO2 99%   BMI 32.28 kg/m   Assessment and Plan:  1. Acute maxillary sinusitis, recurrence not specified New onset.  Persistent.  Stable.  Currently is taking Augmentin but continues to have a purulent discharge with some blood tinge.  There is no tenderness over the maxillary but there may be involvement of the ethmoid sphenoid.  We will get a sinus x-ray view to see if there is an opacified sinus as well as a CBC to determine if there is elevated white count.  2. Fatigue, unspecified type Patient has overall fatigue which may be secondary to the infection at this time.  There is no significant  adenopathy.  Given the upper respiratory question we will check for mono given her fatigue status as well as a CBC. - DG Sinuses Complete; Future - DG Chest 2 View; Future  3. Acute cough Acute.  Persistent.  Had some productivity of her cough earlier this morning of yellow to blood-tinged.  Given her overall fatigue even though her pulse ox is 99% we will check a chest x-ray to rule out possibility of community-acquired pneumonia. - DG Chest 2 View; Future

## 2021-11-19 ENCOUNTER — Ambulatory Visit: Payer: BC Managed Care – PPO | Admitting: Neurology

## 2021-12-12 ENCOUNTER — Other Ambulatory Visit: Payer: Self-pay

## 2021-12-12 ENCOUNTER — Ambulatory Visit
Admission: EM | Admit: 2021-12-12 | Discharge: 2021-12-12 | Disposition: A | Discharge status: Home / Self Care | Payer: BC Managed Care – PPO | Attending: Emergency Medicine | Admitting: Emergency Medicine

## 2021-12-12 DIAGNOSIS — M25552 Pain in left hip: Secondary | ICD-10-CM | POA: Insufficient documentation

## 2021-12-12 DIAGNOSIS — M7918 Myalgia, other site: Secondary | ICD-10-CM | POA: Diagnosis not present

## 2021-12-12 LAB — URINALYSIS, ROUTINE W REFLEX MICROSCOPIC
Bilirubin Urine: NEGATIVE
Glucose, UA: NEGATIVE mg/dL
Hgb urine dipstick: NEGATIVE
Ketones, ur: NEGATIVE mg/dL
Leukocytes,Ua: NEGATIVE
Nitrite: NEGATIVE
Protein, ur: NEGATIVE mg/dL
Specific Gravity, Urine: 1.02 (ref 1.005–1.030)
pH: 7 (ref 5.0–8.0)

## 2021-12-12 MED ORDER — LIDOCAINE 5 % EX PTCH: 1.0000 | MEDICATED_PATCH | CUTANEOUS | 0 refills | Status: DC

## 2021-12-12 MED ORDER — CYCLOBENZAPRINE HCL 10 MG PO TABS: 10.0000 mg | ORAL_TABLET | Freq: Three times a day (TID) | ORAL | 0 refills | Status: DC | PRN

## 2021-12-12 MED ORDER — HYDROCODONE-ACETAMINOPHEN 5-325 MG PO TABS
1.0000 | ORAL_TABLET | Freq: Four times a day (QID) | ORAL | 0 refills | Status: DC | PRN
Start: 2021-12-12 — End: 2022-01-07

## 2021-12-12 MED ORDER — IBUPROFEN 600 MG PO TABS: 600.0000 mg | ORAL_TABLET | Freq: Four times a day (QID) | ORAL | 0 refills | Status: DC | PRN

## 2021-12-12 NOTE — ED Triage Notes (Signed)
Pt here with C/O left lower back pain radiates to front as well 3 days. Denies any other SX.

## 2021-12-12 NOTE — ED Provider Notes (Signed)
HPI  SUBJECTIVE:  Caitlin Cross is a 56 y.o. female who presents with left buttock pain that wraps around her hip, radiating to the groin starting 2 days ago.  She describes it as stabbing, sharp, pinching and constant.  She states that it is very sensitive to touch.  No rash, fevers, trauma, bruising, urinary complaints, abdominal pain.  She had a normal bowel movement yesterday, with no change in pain.  States that she is unable to sleep at night secondary to the pain.  She has tried increasing water and cranberry juice, taking Tylenol.  The Tylenol helps.  Last dose of Tylenol was within 6 hours of evaluation.  Symptoms are better with walking and with standing still, worse with palpation, clothing, hip movement.  She has a past medical history of varicella.  No history of osteoporosis, diabetes, hypertension, chronic kidney disease.  Past Medical History:  Diagnosis Date   Anemia     Past Surgical History:  Procedure Laterality Date   CESAREAN SECTION     x 2    Family History  Problem Relation Age of Onset   Diabetes Mother    Heart disease Father    Stroke Father    Diabetes Sister    Diabetes Brother    Diabetes Maternal Grandmother     Social History   Tobacco Use   Smoking status: Former   Smokeless tobacco: Never  Building services engineerVaping Use   Vaping Use: Never used  Substance Use Topics   Alcohol use: Yes   Drug use: Never    No current facility-administered medications for this encounter.  Current Outpatient Medications:    Ascorbic Acid (VITAMIN C) 1000 MG tablet, Take 2,000 mg by mouth daily., Disp: , Rfl:    HYDROcodone-acetaminophen (NORCO/VICODIN) 5-325 MG tablet, Take 1-2 tablets by mouth every 6 (six) hours as needed for moderate pain or severe pain., Disp: 12 tablet, Rfl: 0   ibuprofen (ADVIL) 600 MG tablet, Take 1 tablet (600 mg total) by mouth every 6 (six) hours as needed., Disp: 30 tablet, Rfl: 0   lidocaine (LIDODERM) 5 %, Place 1 patch onto the skin daily.  Remove & Discard patch within 12 hours or as directed by MD, Disp: 15 patch, Rfl: 0   Multiple Vitamins-Minerals (WOMENS MULTIVITAMIN PO), Take 1 tablet by mouth daily., Disp: , Rfl:    Omega-3 Fatty Acids (FISH OIL) 1000 MG CAPS, Take 1 capsule by mouth daily., Disp: , Rfl:    cyclobenzaprine (FLEXERIL) 10 MG tablet, Take 1 tablet (10 mg total) by mouth 3 (three) times daily as needed for muscle spasms., Disp: 20 tablet, Rfl: 0   rosuvastatin (CRESTOR) 10 MG tablet, Take 1 tablet (10 mg total) by mouth daily., Disp: 30 tablet, Rfl: 1  No Known Allergies   ROS  As noted in HPI.   Physical Exam  BP 119/75 (BP Location: Left Arm)    Pulse 80    Temp 98.6 F (37 C) (Oral)    Resp 16    Ht 5\' 5"  (1.651 m)    Wt 90.7 kg    SpO2 99%    BMI 33.28 kg/m   Constitutional: Well developed, well nourished, peers uncomfortable.  Ambulatory. Eyes:  EOMI, conjunctiva normal bilaterally HENT: Normocephalic, atraumatic,mucus membranes moist Respiratory: Normal inspiratory effort Cardiovascular: Normal rate GI: nondistended.  Positive left lower quadrant, groin tenderness.  No guarding, rebound skin: No rash in the area of pain of her buttocks, thigh, groin, mons pubis.  skin intact Musculoskeletal: Diffuse  tenderness over the left hip and buttock.  No tenderness at the sciatic sciatic notch.  Pain with all active left hip range of motion.  No other tenderness down her left leg Neurologic: Alert & oriented x 3, no focal neuro deficits Psychiatric: Speech and behavior appropriate   ED Course   Medications - No data to display  Orders Placed This Encounter  Procedures   Urinalysis, Routine w reflex microscopic Urine, Clean Catch    Standing Status:   Standing    Number of Occurrences:   1    Results for orders placed or performed during the hospital encounter of 12/12/21 (from the past 24 hour(s))  Urinalysis, Routine w reflex microscopic Urine, Clean Catch     Status: None   Collection Time:  12/12/21  1:30 PM  Result Value Ref Range   Color, Urine YELLOW YELLOW   APPearance CLEAR CLEAR   Specific Gravity, Urine 1.020 1.005 - 1.030   pH 7.0 5.0 - 8.0   Glucose, UA NEGATIVE NEGATIVE mg/dL   Hgb urine dipstick NEGATIVE NEGATIVE   Bilirubin Urine NEGATIVE NEGATIVE   Ketones, ur NEGATIVE NEGATIVE mg/dL   Protein, ur NEGATIVE NEGATIVE mg/dL   Nitrite NEGATIVE NEGATIVE   Leukocytes,Ua NEGATIVE NEGATIVE   No results found.  ED Clinical Impression  1. Left buttock pain   2. Left hip pain      ED Assessment/Plan  [place] Narcotic database reviewed for this patient, and feel that the risk/benefit ratio today is favorable for proceeding with a prescription for controlled substance.  No opiate prescriptions in 2 years  Urinalysis negative for UTI or hematuria.  The pain that she describes is lower than her kidney.  Patient has constant, sharp, stabbing, pinching pain, hypersensitivity in the L1/L2 dermatome.  There is no rash yet.  She is ambulatory.  She denies history of trauma.  Do not feel that she has acute arthritis, SI joint dysfunction, fracture or dislocation.  Concern for shingles that has not yet declared itself.  Will send home with Lidoderm patches, ibuprofen combined with a Tylenol containing product 3-4 times a day, and Flexeril to help her sleep at night.  She is to return here or follow-up with her PCP in 2 to 3 days if not getting any better, or if she develops a rash.  Having her come back to confirm diagnosis of shingles and we can prescribe Valtrex at that time.  2-day work note  Discussed MDM, treatment plan, and plan for follow-up with patient. patient agrees with plan.   Meds ordered this encounter  Medications   DISCONTD: cyclobenzaprine (FLEXERIL) 10 MG tablet    Sig: Take 1 tablet (10 mg total) by mouth 3 (three) times daily as needed for muscle spasms.    Dispense:  20 tablet    Refill:  0   ibuprofen (ADVIL) 600 MG tablet    Sig: Take 1 tablet (600 mg  total) by mouth every 6 (six) hours as needed.    Dispense:  30 tablet    Refill:  0   lidocaine (LIDODERM) 5 %    Sig: Place 1 patch onto the skin daily. Remove & Discard patch within 12 hours or as directed by MD    Dispense:  15 patch    Refill:  0   HYDROcodone-acetaminophen (NORCO/VICODIN) 5-325 MG tablet    Sig: Take 1-2 tablets by mouth every 6 (six) hours as needed for moderate pain or severe pain.    Dispense:  12  tablet    Refill:  0   cyclobenzaprine (FLEXERIL) 10 MG tablet    Sig: Take 1 tablet (10 mg total) by mouth 3 (three) times daily as needed for muscle spasms.    Dispense:  20 tablet    Refill:  0      *This clinic note was created using [employer]. Therefore, there may be occasional mistakes despite careful proofreading.  ?    Melynda Ripple, MD 12/14/21 [phone]

## 2021-12-12 NOTE — Discharge Instructions (Addendum)
Try the Lidoderm.  Take 600 mg of ibuprofen with a Tylenol containing product 3-4 times a day as needed for pain.  Either take ibuprofen with 1000 mg of Tylenol for mild to moderate pain, or 1-2 Norco for severe pain.  Do not take Tylenol and Norco, as they both have Tylenol in them, and too much Tylenol can deliver.  Do not take a Tylenol containing products more than 4 times a day.  Try the Lidoderm patch near your buttock where the pain starts.  Flexeril is a muscle relaxant and will help you sleep.  Return here or see your doctor if not getting better in several days, or if you develop a rash

## 2022-01-07 ENCOUNTER — Emergency Department
Admission: EM | Admit: 2022-01-07 | Discharge: 2022-01-07 | Disposition: A | Discharge status: Home / Self Care | Payer: BC Managed Care – PPO | Attending: Emergency Medicine | Admitting: Emergency Medicine

## 2022-01-07 ENCOUNTER — Other Ambulatory Visit: Payer: Self-pay

## 2022-01-07 ENCOUNTER — Emergency Department: Payer: BC Managed Care – PPO

## 2022-01-07 DIAGNOSIS — R202 Paresthesia of skin: Secondary | ICD-10-CM | POA: Diagnosis not present

## 2022-01-07 DIAGNOSIS — R079 Chest pain, unspecified: Secondary | ICD-10-CM | POA: Diagnosis not present

## 2022-01-07 DIAGNOSIS — R0789 Other chest pain: Secondary | ICD-10-CM | POA: Diagnosis not present

## 2022-01-07 DIAGNOSIS — I10 Essential (primary) hypertension: Secondary | ICD-10-CM | POA: Insufficient documentation

## 2022-01-07 LAB — TROPONIN I (HIGH SENSITIVITY)
Troponin I (High Sensitivity): 5 ng/L (ref <18)
Troponin I (High Sensitivity): 5 ng/L (ref <18)

## 2022-01-07 LAB — CBC
HCT: 42.7 % (ref 36.0–46.0)
Hemoglobin: 13.7 g/dL (ref 12.0–15.0)
MCH: 26.9 pg (ref 26.0–34.0)
MCHC: 32.1 g/dL (ref 30.0–36.0)
MCV: 83.7 fL (ref 80.0–100.0)
Platelets: 276 10*3/uL (ref 150–400)
RBC: 5.1 MIL/uL (ref 3.87–5.11)
RDW: 12.7 % (ref 11.5–15.5)
WBC: 7.1 10*3/uL (ref 4.0–10.5)
nRBC: 0 % (ref 0.0–0.2)

## 2022-01-07 LAB — BASIC METABOLIC PANEL
Anion gap: 10 (ref 5–15)
BUN: 16 mg/dL (ref 6–20)
CO2: 29 mmol/L (ref 22–32)
Calcium: 9.8 mg/dL (ref 8.9–10.3)
Chloride: 100 mmol/L (ref 98–111)
Creatinine, Ser: 0.63 mg/dL (ref 0.44–1.00)
GFR, Estimated: >60 mL/min (ref >60)
Glucose, Bld: 103 mg/dL — ABNORMAL HIGH (ref 70–99)
Potassium: 3.7 mmol/L (ref 3.5–5.1)
Sodium: 139 mmol/L (ref 135–145)

## 2022-01-07 MED ORDER — ACETAMINOPHEN 500 MG PO TABS
1000.0000 mg | ORAL_TABLET | Freq: Once | ORAL | Status: AC
  Administered 2022-01-07: 1000 mg via ORAL
  Filled 2022-01-07: qty 2

## 2022-01-07 MED ORDER — IBUPROFEN 400 MG PO TABS
400.0000 mg | ORAL_TABLET | Freq: Once | ORAL | Status: AC
  Administered 2022-01-07: 400 mg via ORAL
  Filled 2022-01-07: qty 1

## 2022-01-07 MED ORDER — AMLODIPINE BESYLATE 5 MG PO TABS
5.0000 mg | ORAL_TABLET | Freq: Every day | ORAL | 11 refills | Status: DC
Start: 2022-01-07 — End: 2022-04-26

## 2022-01-07 NOTE — ED Provider Notes (Signed)
[facility] Provider Note    Event Date/Time   First MD Initiated Contact with Patient 01/07/22 1829     (approximate)   History   No chief complaint on file.   HPI  Caitlin Cross is a 56 y.o. female with past medical history of anemia who presents for evaluation of some chest tightness associate with tingling in her hands and high blood pressure that occurred earlier today while she was at work.  She states her blood pressure was in the 170s when they checked at work and she was advised to come to emergency room.  She denies any history of high blood pressure.  She denies any associated back pain, headache, earache, sore throat, cough, fevers, vomiting, diarrhea, urinary symptoms, rash or extremity weakness numbness or anything else in the extremities other than tingling in hands.  No other acute concerns at this time.  She does state that all of her symptoms have vastly improved since coming to the emergency room.  Denies any EtOH use, illicit drug use or tobacco abuse.  No trauma or injuries.    Past Medical History:  Diagnosis Date   Anemia      Physical Exam  Triage Vital Signs: ED Triage Vitals  Enc Vitals Group     BP 01/07/22 1538 (!) 159/93     Pulse Rate 01/07/22 1538 71     Resp 01/07/22 1538 18     Temp 01/07/22 1538 98.1 F (36.7 C)     Temp Source 01/07/22 1538 Oral     SpO2 01/07/22 1538 99 %     Weight 01/07/22 1540 200 lb (90.7 kg)     Height 01/07/22 1540 5\' 5"  (1.651 m)     Head Circumference --      Peak Flow --      Pain Score 01/07/22 1539 5     Pain Loc --      Pain Edu? --      Excl. in [place]? --     Most recent vital signs: Vitals:   01/07/22 1538 01/07/22 1844  BP: (!) 159/93 (!) 150/88  Pulse: 71 70  Resp: 18 18  Temp: 98.1 F (36.7 C)   SpO2: 99% 99%    General: Awake, no distress.  CV:  Good peripheral perfusion.  2+ radial pulses.  No murmurs rubs or gallops. Resp:  Normal effort.  Clear  bilaterally. Abd:  No distention.  Soft throughout. Other:  Cranial nerves II through XII grossly intact.  No pronator drift.  No finger dysmetria.  Symmetric 5/5 strength of all extremities.  Sensation intact to light touch in all extremities.  Unremarkable unassisted gait.    ED Results / Procedures / Treatments  Labs (all labs ordered are listed, but only abnormal results are displayed) Labs Reviewed  BASIC METABOLIC PANEL - Abnormal; Notable for the following components:      Result Value   Glucose, Bld 103 (*)    All other components within normal limits  CBC  TROPONIN I (HIGH SENSITIVITY)  TROPONIN I (HIGH SENSITIVITY)     EKG  ECG is remarkable for sinus rhythm with a ventricular rate of 74, normal axis, unremarkable intervals without evidence of acute ischemia or significant arrhythmia.   RADIOLOGY Chest reviewed by myself shows no focal consoidation, effusion, edema, pneumothorax or other clear acute thoracic process. I also reviewed radiology interpretation and agree with findings described.    PROCEDURES:  Critical Care performed: No  Procedures  MEDICATIONS ORDERED IN ED: Medications  acetaminophen (TYLENOL) tablet 1,000 mg (has no administration in time range)  ibuprofen (ADVIL) tablet 400 mg (has no administration in time range)     IMPRESSION / MDM / ASSESSMENT AND PLAN / ED COURSE  I reviewed the triage vital signs and the nursing notes.                              Differential diagnosis includes, but is not limited to ACS, metabolic derangements, anemia with a lower suspicion based on my history and exam for CVA dissection or PE at this time.  No evidence of CVA.  ECG is remarkable for sinus rhythm with a ventricular rate of 74, normal axis, unremarkable intervals without evidence of acute ischemia or significant arrhythmia.  Given nonelevated troponin x2 have a low suspicion for ACS or myocarditis.  Chest reviewed by myself shows no focal  consoidation, effusion, edema, pneumothorax or other clear acute thoracic process. I also reviewed radiology interpretation and agree with findings described.  CMP without any significant electrolyte or metabolic derangements.  CBC without leukocytosis or acute anemia.  Overall unclear etiology of patient's symptoms and I considered observation and additional diagnostic studies given stable vitals otherwise reassuring exam and work-up and low suspicion for immediately life-threatening pathology I think she is stable for discharge with continued outpatient evaluation.  Will start on low-dose amlodipine for blood pressure.  Discussed returning for any new or worsening symptoms.  Discharged in stable condition.  Strict return precautions advised and discussed.      FINAL CLINICAL IMPRESSION(S) / ED DIAGNOSES   Final diagnoses:  Chest pain, unspecified type  Hypertension, unspecified type  Paresthesia     Rx / DC Orders   ED Discharge Orders          Ordered    amLODipine (NORVASC) 5 MG tablet  Daily        01/07/22 1919             Note:  This document was prepared using Dragon voice recognition software and may include unintentional dictation errors.   Lucrezia Starch, MD 01/07/22 Dorthula Perfect

## 2022-01-07 NOTE — ED Notes (Signed)
EKG to EDP Goodman in person.  

## 2022-01-07 NOTE — ED Triage Notes (Signed)
Pt c/o high BP, chest discomfort, and numbness/tingling in hands and feet at work today. Pt in NAD; resp reg/unlabored; skin dry; sitting calmly in wheelchair.

## 2022-01-08 ENCOUNTER — Ambulatory Visit: Payer: BC Managed Care – PPO | Admitting: Family Medicine

## 2022-01-09 ENCOUNTER — Ambulatory Visit (INDEPENDENT_AMBULATORY_CARE_PROVIDER_SITE_OTHER): Payer: BC Managed Care – PPO | Admitting: Advanced Practice Midwife

## 2022-01-09 ENCOUNTER — Encounter: Payer: Self-pay | Admitting: Advanced Practice Midwife

## 2022-01-09 ENCOUNTER — Other Ambulatory Visit (HOSPITAL_COMMUNITY)
Admission: RE | Admit: 2022-01-09 | Discharge: 2022-01-09 | Disposition: A | Discharge status: Home / Self Care | Payer: BC Managed Care – PPO | Source: Ambulatory Visit | Attending: Family Medicine | Admitting: Family Medicine

## 2022-01-09 ENCOUNTER — Other Ambulatory Visit: Payer: Self-pay

## 2022-01-09 VITALS — BP 123/77 | Ht 65.0 in | Wt 203.0 lb

## 2022-01-09 DIAGNOSIS — R102 Pelvic and perineal pain: Secondary | ICD-10-CM | POA: Diagnosis not present

## 2022-01-09 DIAGNOSIS — R079 Chest pain, unspecified: Secondary | ICD-10-CM

## 2022-01-09 DIAGNOSIS — R1909 Other intra-abdominal and pelvic swelling, mass and lump: Secondary | ICD-10-CM | POA: Diagnosis not present

## 2022-01-09 NOTE — Patient Instructions (Signed)
Linfedema ?Lymphedema ?El linfedema es una hinchaz?n causada por la acumulaci?n anormal de linfa en el tejido debajo de la piel. La linfa es el exceso de l?quido de los tejidos del cuerpo que se elimina a trav?s del sistema linf?tico. Este sistema forma parte del sistema de defensa del cuerpo (sistema inmunitario) e incluye los ganglios linf?ticos y vasos linf?ticos. Los vasos linf?ticos recogen el exceso de l?quido, las grasas, las prote?nas y los desechos de los tejidos del cuerpo, y los llevan al torrente sangu?neo. Adem?s, la funci?n de este sistema es limpiar y Radiographer, therapeutic las bacterias y los productos de desecho del organismo. ?El linfedema ocurre cuando hay una obstrucci?n en el sistema linf?tico. Cuando los vasos o ganglios linf?ticos est?n obstruidos o da?ados, la linfa no drena correctamente. Esto causa una acumulaci?n anormal de linfa, lo que provoca hinchaz?n en la zona afectada. Esto puede incluir la zona del tronco, o en un brazo o una pierna. El linfedema no se cura con medicamentos, pero se pueden usar diferentes m?todos para ayudar a Clinical cytogeneticist hinchaz?n. ??Cu?les son las causas? ?La causa de esta afecci?n depende del tipo de linfedema que tenga. ?El linfedema primario se debe a la ausencia de vasos linf?ticos o a una anomal?a de los vasos linf?ticos en el nacimiento. ?El linfedema secundario ocurre cuando los vasos linf?ticos est?n obstruidos o da?ados. El linfedema secundario es el m?s frecuente. Las causas frecuentes de la obstrucci?n de los vasos linf?ticos incluyen las siguientes: ?Infecci?n cut?nea, como celulitis. ?Infecci?n por par?sitos (filariasis). ?Lesiones. ?Radioterapia. ?C?ncer. ?Formaci?n de tejido cicatricial. ?Cirug?a. ??Cu?les son los signos o s?ntomas? ?Los s?ntomas de esta afecci?n incluyen: ?Hinchaz?n del brazo o la pierna. ?Sensaci?n de pesadez u opresi?n en el brazo o la pierna. ?Hinchaz?n de los pies, los dedos de los pies o los dedos de la [place]. Es posible que los zapatos o  los anillos est?n m?s ajustados que antes. ?Enrojecimiento de la piel sobre la zona afectada. ?Movilidad limitada de la extremidad afectada. ?Sensibilidad al tacto o molestias en la extremidad afectada. ??C?mo se diagnostica? ?Esta afecci?n se puede diagnosticar en funci?n de lo siguiente: ?Los s?ntomas y los antecedentes m?dicos. ?Un examen f?sico. ?Espectroscopia de bioimpedancia. En esta prueba, se utilizan corrientes el?ctricas indoloras para medir los niveles de l?quido del cuerpo. ?Pruebas de diagn?stico por im?genes, por ejemplo: ?Resonancia magn?tica (RM). ?Exploraci?n por tomograf?a computarizada (TC). ?Ecograf?a d?plex. Para este estudio, se usan ondas sonoras para producir im?genes de los vasos y la circulaci?n sangu?nea en una pantalla. ?Linfogammagraf?a. Para este estudio, se inyecta una dosis baja de una sustancia radiactiva para seguir la trayectoria del flujo de la linfa a trav?s de los vasos linf?ticos. ?Linfangiograf?a. Para este estudio, se inyecta un tinte de contraste en el vaso linf?tico para ayudar a mostrar las obstrucciones. ??C?mo se trata? ?Si la causa del linfedema es una afecci?n subyacente, se tratar? esa afecci?n. Por ejemplo, se pueden administrar antibi?ticos para tratar una infecci?n. ?El tratamiento de esta afecci?n depender? de la causa del linfedema. El tratamiento puede incluir: ?Terapia descongestiva compleja [facility]). Esto lo hace un terapeuta certificado en linfedema para reducir la congesti?n de l?quidos. Esta terapia incluye: ?Cuidado de la piel. ?Envoltura de compresi?n de la zona afectada. ?Drenaje linf?tico manual. Esta es una t?cnica de masaje especial que promueve el drenaje linf?tico de una extremidad. ?Ejercicios espec?ficos. Determinados ejercicios pueden ayudar a que el l?quido salga de la extremidad afectada. ?Compresi?n. Se pueden usar diferentes m?todos para aplicar presi?n en la extremidad afectada para reducir la hinchaz?n. Incluyen los siguientes: ?Usar  medias o  mangas de compresi?n en la extremidad afectada. ?Envolver la extremidad afectada con vendas especiales. ?Cirug?a[place]more [place], solo se realiza [employer] son graves. Por ejemplo, se puede hacer una cirug?a si tiene dificultad para mover la extremidad o si la hinchaz?n no mejora con otros tratamientos. ?Siga estas instrucciones en su casa: ?Autocuidado ?Es m?s probable que la zona afectada se lesione o se infecte. Siga estos pasos para evitar las infecciones: ?Mantenga la zona afectada limpia y seca. ?El uso aprobado de cremas o lociones para Theatre manager la piel [place]. ?Evite cortarse la piel: ?Utilice guantes mientras cocina o hace jardiner?a. ?No camine descalzo. ?Si se rasura la zona afectada, use una afeitadora el?ctrica. ?No use ropa, zapatos ni alhajas que le ajusten. ?Siga una dieta saludable que incluya muchas frutas y verduras. ?Actividad ?Haga ejercicio como se lo haya indicado el m?dico. ?No se siente con las piernas cruzadas. ?Siempre que sea posible, mantenga elevada la extremidad afectada por encima del nivel del coraz?n. ?No cargue objetos con el brazo afectado por el linfedema. ?Indicaciones generales ?Use las mangas o medias de compresi?n como se lo haya indicado su m?dico. ?Advierta cualquier cambio en el tama?o de la extremidad afectada. Pueden indicarle que se mida la zona afectada de forma regular y que mantenga un seguimiento de las mediciones. ?Use los medicamentos de venta libre y los recetados solamente como se lo haya indicado el m?dico. ?Si le recetaron un antibi?tico, t?melo o apl?queselo como se lo haya indicado el m?dico. No deje de recibir los antibi?ticos aunque comience a sentirse mejor o si la afecci?n mejora. ?No use almohadillas t?rmicas ni compresas de hielo sobre la zona afectada. ?Evite que le extraigan sangre, le coloquen una v?a intravenosa o le tomen la presi?n arterial en la extremidad afectada. ?Concurra a [place]. Esto es  importante. ?Comun?quese con un m?dico si: ?Contin?a teniendo la extremidad hinchada. ?Tiene fugas de l?quido de la piel de la extremidad hinchada. ?Tiene un corte que no cicatriza. ?Tiene enrojecimiento o dolor en la zona afectada. ?Le aparecen [employer], una erupci?n, ampollas o llagas (lesiones) en la extremidad afectada. ?Solicite ayuda de inmediato si: ?Tiene un nuevo episodio de hinchaz?n repentino en la extremidad. ?Le falta el aire o le duele el pecho. ?Tiene fiebre o escalofr?os. ?Estos s?ntomas pueden representar un problema grave que constituye Engineer, maintenance (IT). No espere a ver si los s?ntomas desaparecen. Solicite atenci?n m?dica de inmediato. Comun?quese con el servicio de emergencias de su localidad (911 en los [place]). No conduzca por sus propios medios [employer]. ?Resumen ?El linfedema es una hinchaz?n causada por la acumulaci?n anormal de linfa en el tejido debajo de la piel. ?La linfa es el l?quido de los tejidos del cuerpo que se elimina a trav?s del sistema linf?tico. Este sistema recoge el exceso de l?quido, las grasas, las prote?nas y los desechos de los tejidos del cuerpo, y los lleva al torrente sangu?neo. ?El linfedema causa hinchaz?n, dolor y enrojecimiento en la zona afectada. Esto puede incluir la zona del tronco, o en un brazo o una pierna. ?El tratamiento de esta afecci?n puede depender de la causa del linfedema. El tratamiento puede incluir el abordaje de la causa subyacente, terapia descongestiva compleja [facility]), m?todos de compresi?n o cirug?a. ?Esta informaci?n no tiene Marine scientist el consejo del m?dico. Aseg?rese de hacerle al m?dico cualquier pregunta que tenga. ?Document Revised: 10/03/2020 Document Reviewed: 09/08/2020 ?Elsevier Patient Education ? [place]. ? ?

## 2022-01-09 NOTE — Progress Notes (Signed)
? ?Patient ID: Caitlin Cross, female   DOB: 1966/04/18, 56 y.o.   MRN: [id number] ? ?Reason for Visit: Pelvic Pain (Left side only mainly inflammation x 2 months) ? ? ?Subjective:  ?Interpreter present during visit ? ?HPI: ? ?Caitlin Cross is a 56 y.o. female being seen for complaint of pain in her back, pelvis and upper left leg for the past 2 months. She reports the back pain has resolved and the pelvic/groin/thigh pain persists. It is accompanied by "inflammation". She has noticed the area has swelling especially in the upper thigh near groin. While sitting up she points out the swelling in her upper left thigh. The pain is sharp and constant. She was seen at [facility] urgent care for the same 1 month ago. Prescribed treatments have not provided relief. Provider suspected early stage of shingles. Patient denies any rash.  ? ?She also reports a vaginal lump that is painful- described as discomfort- and it may have pus. She denies specific vaginal complaints including itching, irritation or odor. She sometimes has a little discharge. She is sexually active and denies concern for STDs although she accepts testing for STDs and vaginitis. ? ?She also mentions an episode of chest pain with high blood pressure, headache and hand tingling that occurred 2 days ago. Work up at ER was negative. She was prescribed Amlodipine. She still has a headache. She requests referral to cardiology. ? ?Past Medical History:  ?Diagnosis Date  ? Anemia   ? Hypercholesterolemia   ? ?Family History  ?Problem Relation Age of Onset  ? Diabetes Mother   ? Heart disease Father   ? Stroke Father   ? Diabetes Sister   ? Diabetes Brother   ? Diabetes Maternal Grandmother   ? ?Past Surgical History:  ?Procedure Laterality Date  ? CESAREAN SECTION    ? x 2  ? ? ?Short Social History:  ?Social History  ? ?Tobacco Use  ? Smoking status: Former  ? Smokeless tobacco: Never  ?Substance Use Topics  ? Alcohol use: Not Currently  ? ? ?No Known  Allergies ? ?Current Outpatient Medications  ?Medication Sig Dispense Refill  ? amLODipine (NORVASC) 5 MG tablet Take 1 tablet (5 mg total) by mouth daily. 30 tablet 11  ? Ascorbic Acid (VITAMIN C) 1000 MG tablet Take 2,000 mg by mouth daily.    ? ibuprofen (ADVIL) 600 MG tablet Take 1 tablet (600 mg total) by mouth every 6 (six) hours as needed. 30 tablet 0  ? Multiple Vitamins-Minerals (WOMENS MULTIVITAMIN PO) Take 1 tablet by mouth daily.    ? Omega-3 Fatty Acids (FISH OIL) 1000 MG CAPS Take 1 capsule by mouth daily.    ? rosuvastatin (CRESTOR) 10 MG tablet Take 1 tablet (10 mg total) by mouth daily. 30 tablet 1  ? ?No current facility-administered medications for this visit.  ? ? ?Review of Systems  ?Constitutional:  Negative for chills and fever.  ?HENT:  Negative for congestion, ear discharge, ear pain, hearing loss, sinus pain and sore throat.   ?Eyes:  Negative for blurred vision and double vision.  ?Respiratory:  Negative for cough, shortness of breath and wheezing.   ?Cardiovascular:  Positive for chest pain. Negative for palpitations and leg swelling.  ?Gastrointestinal:  Negative for abdominal pain, blood in stool, constipation, diarrhea, heartburn, melena, nausea and vomiting.  ?Genitourinary:  Negative for dysuria, flank pain, frequency, hematuria and urgency.  ?     Positive for painful vaginal lump  ?Musculoskeletal:  Positive for back pain  and joint pain. Negative for myalgias.  ?Skin:  Negative for itching and rash.  ?Neurological:  Positive for headaches. Negative for dizziness, tingling, tremors, sensory change, speech change, focal weakness, seizures, loss of consciousness and weakness.  ?Endo/Heme/Allergies:  Negative for environmental allergies. Does not bruise/bleed easily.  ?Psychiatric/Behavioral:  Negative for depression, hallucinations, memory loss, substance abuse and suicidal ideas. The patient is not nervous/anxious and does not have insomnia.   ? ? ?   ?Objective:  ?Objective   ? ?Vitals:  ? 01/09/22 1115  ?BP: 123/77  ?Weight: 203 lb (92.1 kg)  ?Height: 5\' 5"  (1.651 m)  ? ?Body mass index is 33.78 kg/m? ?Constitutional: Well nourished, well developed female in no acute distress.  ?HEENT: normal ?Skin: Warm and dry.  ?Cardiovascular: Regular rate and rhythm.   ?Extremity:  edema in upper left thigh noted when sitting up and not able to appreciate the same in supine position, some tenderness to palpation   ?Respiratory: Clear to auscultation bilateral. Normal respiratory effort ?Back: no CVAT ?Neuro: DTRs 2+, Cranial nerves grossly intact ?Psych: Alert and Oriented x3. No memory deficits. Normal mood and affect.  ? ?Pelvic exam: (female chaperone present) ?is not limited by body habitus ?EGBUS: 1.5 cm x 1 cm pus filled lump/cyst in left buttock near perineum- tender to palpation- she accepts lancing procedure for drainage ?Vagina: within normal limits and with normal mucosa  ? ?Procedure: area prepped with alcohol wipe, 1.5 ml of 1% lidocaine injected around site of lump, small 0.5 cm incision made with scalpel and thick white pus easily removed from cyst. Left open to allow for continued drainage and secondary intention healing. Scant bleeding. Gauze placed over area. Recommend daily soaks in sea salt bath. ?Assessment/Plan:  ?  ? ?56 y.o. G4 P4 female with pain/swelling in left thigh/groin possible lymph edema, lanced cyst of buttock ? ?Daily soaks in sea salt bath, Return to clinic for worsening symptoms ?Follow up as needed with PCP regarding swelling/pain in thigh/groin ?Referral to cardiology for recent chest pain  ? ? ?G10 CNM ?[facility] ?[facility] ?01/09/2022, 4:00 PM ? ? ?

## 2022-01-10 LAB — CERVICOVAGINAL ANCILLARY ONLY
Bacterial Vaginitis (gardnerella): POSITIVE — AB
Candida Glabrata: NEGATIVE
Candida Vaginitis: NEGATIVE
Chlamydia: NEGATIVE
Comment: NEGATIVE
Comment: NEGATIVE
Comment: NEGATIVE
Comment: NEGATIVE
Comment: NEGATIVE
Comment: NORMAL
Neisseria Gonorrhea: NEGATIVE
Trichomonas: NEGATIVE

## 2022-01-14 ENCOUNTER — Other Ambulatory Visit: Payer: Self-pay | Admitting: Advanced Practice Midwife

## 2022-01-14 DIAGNOSIS — B9689 Other specified bacterial agents as the cause of diseases classified elsewhere: Secondary | ICD-10-CM

## 2022-01-14 DIAGNOSIS — B3731 Acute candidiasis of vulva and vagina: Secondary | ICD-10-CM

## 2022-01-14 MED ORDER — METRONIDAZOLE 500 MG PO TABS
500.0000 mg | ORAL_TABLET | Freq: Two times a day (BID) | ORAL | 0 refills | Status: AC
Start: 2022-01-14 — End: 2022-01-21

## 2022-01-14 MED ORDER — FLUCONAZOLE 150 MG PO TABS: 150.0000 mg | ORAL_TABLET | Freq: Once | ORAL | 3 refills | Status: AC

## 2022-01-14 NOTE — Progress Notes (Signed)
Rx's metronidazole and diflucan. Message to patient regarding dx and rx's. ?

## 2022-01-24 ENCOUNTER — Other Ambulatory Visit: Payer: Self-pay

## 2022-01-24 ENCOUNTER — Ambulatory Visit: Payer: BC Managed Care – PPO | Admitting: Cardiology

## 2022-01-24 ENCOUNTER — Encounter: Payer: Self-pay | Admitting: Cardiology

## 2022-01-24 VITALS — BP 110/70 | HR 81 | Ht 65.0 in | Wt 206.0 lb

## 2022-01-24 DIAGNOSIS — E782 Mixed hyperlipidemia: Secondary | ICD-10-CM | POA: Diagnosis not present

## 2022-01-24 DIAGNOSIS — E7800 Pure hypercholesterolemia, unspecified: Secondary | ICD-10-CM

## 2022-01-24 DIAGNOSIS — R072 Precordial pain: Secondary | ICD-10-CM | POA: Diagnosis not present

## 2022-01-24 DIAGNOSIS — I10 Essential (primary) hypertension: Secondary | ICD-10-CM | POA: Diagnosis not present

## 2022-01-24 MED ORDER — METOPROLOL TARTRATE 100 MG PO TABS
100.0000 mg | ORAL_TABLET | Freq: Once | ORAL | 0 refills | Status: DC
Start: 2022-01-24 — End: 2022-04-18

## 2022-01-24 MED ORDER — IVABRADINE HCL 5 MG PO TABS: 10.0000 mg | ORAL_TABLET | Freq: Once | ORAL | 0 refills | Status: AC

## 2022-01-24 MED ORDER — ROSUVASTATIN CALCIUM 20 MG PO TABS: 20.0000 mg | ORAL_TABLET | Freq: Every day | ORAL | 1 refills | Status: AC

## 2022-01-24 NOTE — Patient Instructions (Signed)
Medication Instructions:  ? ?Your physician has recommended you make the following change in your medication:  ? ? INCREASE your Rosuvastatin 20 MG once a day. ? ?*If you need a refill on your cardiac medications before your next appointment, please call your pharmacy* ? ? ?Lab Work: ?None ordered ?If you have labs (blood work) drawn today and your tests are completely normal, you will receive your results only by: ?MyChart Message (if you have MyChart) OR ?A paper copy in the mail ?If you have any lab test that is abnormal or we need to change your treatment, we will call you to review the results. ? ? ?Testing/Procedures: ? ?Your physician has requested that you have an echocardiogram. Echocardiography is a painless test that uses sound waves to create images of your heart. It provides your doctor with information about the size and shape of your heart and how well your heart?s chambers and valves are working. This procedure takes approximately one hour. There are no restrictions for this procedure. ? ?Your physician has requested that you have cardiac CT. Cardiac computed tomography (CT) is a painless test that uses an x-ray machine to take clear, detailed pictures of your heart.  ? ?Your cardiac CT will be scheduled at: ? ?[facility] ?[address] ?[address] ?[place], [place] [address] ?[phone] ? ? ?Thursday 01/31/22 at 2:30 PM ? ?Please arrive 15 mins early for check-in and test prep. ? ? ? ?Please follow these instructions carefully (unless otherwise directed): ? ? ? ?On the Night Before the Test: ?Be sure to Drink plenty of water. ?Do not consume any caffeinated/decaffeinated beverages or chocolate 12 hours prior to your test. ? ? ? ?On the Day of the Test: ?Drink plenty of water until 1 hour prior to the test. ?Do not eat any food 4 hours prior to the test. ?You may take your regular medications prior to the test.  ?Take metoprolol (Lopressor) 100 MG two hours prior  to test. ?Take Ivabradine (Corlanor) 10 MG two hours prior to test. ?FEMALES- please wear underwire-free bra if available, avoid dresses & tight clothing ? ? ?     ?After the Test: ?Drink plenty of water. ?After receiving IV contrast, you may experience a mild flushed feeling. This is normal. ?On occasion, you may experience a mild rash up to 24 hours after the test. This is not dangerous. If this occurs, you can take Benadryl 25 mg and increase your fluid intake. ?If you experience trouble breathing, this can be serious. If it is severe call 911 IMMEDIATELY. If it is mild, please call our office. ?If you take any of these medications: Glipizide/Metformin, Avandament, Glucavance, please do not take 48 hours after completing test unless otherwise instructed. ? ?Please allow 2-4 weeks for scheduling of routine cardiac CTs. Some insurance companies require a pre-authorization which may delay scheduling of this test.  ? ?For non-scheduling related questions, please contact the cardiac imaging nurse navigator should you have any questions/concerns: ?Rockwell Alexandria, Cardiac Imaging Nurse Navigator ?Larey Brick, Cardiac Imaging Nurse Navigator ?[facility] ?Direct Office Dial: [phone]  ? ?For scheduling needs, including cancellations and rescheduling, please call [place], [phone].  ? ? ? ?Follow-Up: ?At [facility], you and your health needs are our priority.  As part of our continuing mission to provide you with exceptional heart care, we have created designated Provider Care Teams.  These Care Teams include your primary Cardiologist (physician) and Advanced Practice Providers (APPs -  Physician Assistants and Nurse Practitioners) who all work together to provide you with the care you need, when you need it. ? ?We recommend signing up for the patient portal called "MyChart".  Sign up information is provided on this After Visit Summary.  MyChart is used to connect with patients for  Virtual Visits (Telemedicine).  Patients are able to view lab/test results, encounter notes, upcoming appointments, etc.  Non-urgent messages can be sent to your provider as well.   ?To learn more about what you can do with MyChart, go to [email or web address].   ? ?Your next appointment:   ?Follow up after testing (CCTA 3/23) ? ?The format for your next appointment:   ?In Person ? ?Provider:   ?You may see Debbe Odea, MD or one of the following Advanced Practice Providers on your designated Care Team:   ?Nicolasa Ducking, NP ?Eula Listen, PA-C ?Cadence Fransico Michael, PA-C  ? ? ?Other Instructions ? ? ?

## 2022-01-24 NOTE — Progress Notes (Signed)
?Cardiology Office Note:   ? ?Date:  01/24/2022  ? ?ID:  Caitlin BuntingMary Cross, DOB 07/10/1966, MRN [id number] ? ?PCP:  Duanne LimerickJones, Deanna C, MD ?  ?[facility] Providers ?Cardiologist:  None    ? ?Referring MD: Duanne LimerickJones, Deanna C, MD  ? ?Chief Complaint  ?Patient presents with  ? New Patient (Initial Visit)  ?  Referred by OB for Chest pain -- when she is going to sleep. . Meds reviewed verbally with patient.   ? ?Caitlin BuntingMary Cross is a 56 y.o. female who is being seen today for the evaluation of chest pain at the request of Duanne LimerickJones, Deanna C, MD. ? ? ?History of Present Illness:   ? ?Caitlin BuntingMary Cross is a 56 y.o. female with a hx of hypertension, hyperlipidemia who presents due to chest pain. ? ?Patient was at work when she suddenly developed chest discomfort, fatigue, shortness of breath.  Checked her blood pressure systolic was in the 170s.  Symptoms occured about 2 to 3 weeks ago.  Her husband who also works at the same company drove patient to the emergency room. ? ?Seen in the ED 01/07/2022 due to chest pain.  Troponins were normal, EKG normal.  BP was elevated, started on amlodipine 5 mg daily. ? ?She has had 2-3 other episodes of chest pain since.  Denies ever having history of elevated blood pressures.  Compliant with amlodipine as prescribed.  Was previously prescribed Crestor, states not taking Crestor as prescribed.  Father passed from heart attack in his 2470s. ? ?Past Medical History:  ?Diagnosis Date  ? Anemia   ? Hypercholesterolemia   ? ? ?Past Surgical History:  ?Procedure Laterality Date  ? CESAREAN SECTION    ? x 2  ? ? ?Current Medications: ?Current Meds  ?Medication Sig  ? amLODipine (NORVASC) 5 MG tablet Take 1 tablet (5 mg total) by mouth daily.  ? Ascorbic Acid (VITAMIN C) 1000 MG tablet Take 2,000 mg by mouth daily.  ? ibuprofen (ADVIL) 600 MG tablet Take 1 tablet (600 mg total) by mouth every 6 (six) hours as needed.  ? ivabradine (CORLANOR) 5 MG TABS tablet Take 2 tablets (10 mg total) by mouth once for 1  dose. Take 2 hours prior to your CT scan  ? metoprolol tartrate (LOPRESSOR) 100 MG tablet Take 1 tablet (100 mg total) by mouth once for 1 dose. Take 2 hours prior to your CT scan.  ? Multiple Vitamins-Minerals (WOMENS MULTIVITAMIN PO) Take 1 tablet by mouth daily.  ? Omega-3 Fatty Acids (FISH OIL) 1000 MG CAPS Take 1 capsule by mouth daily.  ? [DISCONTINUED] rosuvastatin (CRESTOR) 10 MG tablet Take 1 tablet (10 mg total) by mouth daily.  ?  ? ?Allergies:   Patient has no known allergies.  ? ?Social History  ? ?Socioeconomic History  ? Marital status: Married  ?  Spouse name: Evelena LeydenJose Garcia  ? Number of children: Not on file  ? Years of education: Not on file  ? Highest education level: Not on file  ?Occupational History  ? Not on file  ?Tobacco Use  ? Smoking status: Former  ? Smokeless tobacco: Never  ?Vaping Use  ? Vaping Use: Never used  ?Substance and Sexual Activity  ? Alcohol use: Not Currently  ? Drug use: Never  ? Sexual activity: Yes  ?  Partners: Male  ?  Birth control/protection: Post-menopausal  ?Other Topics Concern  ? Not on file  ?Social History Narrative  ? Not on file  ? ?Social Determinants of  Health  ? ?Financial Resource Strain: Not on file  ?Food Insecurity: Not on file  ?Transportation Needs: Not on file  ?Physical Activity: Not on file  ?Stress: Not on file  ?Social Connections: Not on file  ?  ? ?Family History: ?The patient's family history includes Diabetes in her brother, maternal grandmother, mother, and sister; Heart disease in her father; Stroke in her father. ? ?ROS:   ?Please see the history of present illness.    ? All other systems reviewed and are negative. ? ?EKGs/Labs/Other Studies Reviewed:   ? ?The following studies were reviewed today: ? ? ?EKG:  EKG is  ordered today.  The ekg ordered today demonstrates normal sinus rhythm, normal ECG ? ?Recent Labs: ?01/07/2022: BUN 16; Creatinine, Ser 0.63; Hemoglobin 13.7; Platelets 276; Potassium 3.7; Sodium 139  ?Recent Lipid Panel ?    ?Component Value Date/Time  ? CHOL 323 (H) 04/18/2021 1137  ? TRIG 112 04/18/2021 1137  ? HDL 55 04/18/2021 1137  ? LDLCALC 248 (H) 04/18/2021 1137  ? ? ? ?Risk Assessment/Calculations:   ? ? ?    ? ?Physical Exam:   ? ?VS:  BP 110/70 (BP Location: Left Arm, Patient Position: Sitting, Cuff Size: Normal)   Pulse 81   Ht 5\' 5"  (1.651 m)   Wt 206 lb (93.4 kg)   SpO2 98%   BMI 34.28 kg/m?    ? ?Wt Readings from Last 3 Encounters:  ?01/24/22 206 lb (93.4 kg)  ?01/09/22 203 lb (92.1 kg)  ?01/07/22 200 lb (90.7 kg)  ?  ? ?GEN:  Well nourished, well developed in no acute distress ?HEENT: Normal ?NECK: No JVD; No carotid bruits ?LYMPHATICS: No lymphadenopathy ?CARDIAC: RRR, no murmurs, rubs, gallops ?RESPIRATORY:  Clear to auscultation without rales, wheezing or rhonchi  ?ABDOMEN: Soft, non-tender, non-distended ?MUSCULOSKELETAL:  No edema; No deformity  ?SKIN: Warm and dry ?NEUROLOGIC:  Alert and oriented x 3 ?PSYCHIATRIC:  Normal affect  ? ?ASSESSMENT:   ? ?1. Precordial pain   ?2. Primary hypertension   ?3. Pure hypercholesterolemia   ?4. Mixed hyperlipidemia   ? ?PLAN:   ? ?In order of problems listed above: ? ?Chest pain, risk factors hypertension, hyperlipidemia.  Get echo, get coronary CTA to evaluate presence of CAD. ?Hypertension, BP controlled, continue amlodipine 5 mg daily. ?Hyperlipidemia, start Crestor 20 mg daily. ? ?Follow-up after cardiac testing. ? ?   ? ?Spanish interpreter used for this visit. ? ? ?Medication Adjustments/Labs and Tests Ordered: ?Current medicines are reviewed at length with the patient today.  Concerns regarding medicines are outlined above.  ?Orders Placed This Encounter  ?Procedures  ? CT CORONARY MORPH W/CTA COR W/SCORE W/CA W/CM &/OR WO/CM  ? Lipid panel  ? EKG 12-Lead  ? ECHOCARDIOGRAM COMPLETE  ? ?Meds ordered this encounter  ?Medications  ? metoprolol tartrate (LOPRESSOR) 100 MG tablet  ?  Sig: Take 1 tablet (100 mg total) by mouth once for 1 dose. Take 2 hours prior to your  CT scan.  ?  Dispense:  1 tablet  ?  Refill:  0  ? ivabradine (CORLANOR) 5 MG TABS tablet  ?  Sig: Take 2 tablets (10 mg total) by mouth once for 1 dose. Take 2 hours prior to your CT scan  ?  Dispense:  2 tablet  ?  Refill:  0  ? rosuvastatin (CRESTOR) 20 MG tablet  ?  Sig: Take 1 tablet (20 mg total) by mouth daily.  ?  Dispense:  90 tablet  ?  Refill:  1  ? ? ?Patient Instructions  ?Medication Instructions:  ? ?Your physician has recommended you make the following change in your medication:  ? ? INCREASE your Rosuvastatin 20 MG once a day. ? ?*If you need a refill on your cardiac medications before your next appointment, please call your pharmacy* ? ? ?Lab Work: ?None ordered ?If you have labs (blood work) drawn today and your tests are completely normal, you will receive your results only by: ?MyChart Message (if you have MyChart) OR ?A paper copy in the mail ?If you have any lab test that is abnormal or we need to change your treatment, we will call you to review the results. ? ? ?Testing/Procedures: ? ?Your physician has requested that you have an echocardiogram. Echocardiography is a painless test that uses sound waves to create images of your heart. It provides your doctor with information about the size and shape of your heart and how well your heart?s chambers and valves are working. This procedure takes approximately one hour. There are no restrictions for this procedure. ? ?Your physician has requested that you have cardiac CT. Cardiac computed tomography (CT) is a painless test that uses an x-ray machine to take clear, detailed pictures of your heart.  ? ?Your cardiac CT will be scheduled at: ? ?[facility] ?[address] ?[address] ?[place], [place] [address] ?[phone] ? ? ?Thursday 01/31/22 at 2:30 PM ? ?Please arrive 15 mins early for check-in and test prep. ? ? ? ?Please follow these instructions carefully (unless otherwise directed): ? ? ? ?On the Night Before  the Test: ?Be sure to Drink plenty of water. ?Do not consume any caffeinated/decaffeinated beverages or chocolate 12 hours prior to your test. ? ? ? ?On the Day of the Test: ?Drink plenty of water until 1 hour pr

## 2022-01-28 ENCOUNTER — Other Ambulatory Visit
Admission: RE | Admit: 2022-01-28 | Discharge: 2022-01-28 | Disposition: A | Discharge status: Home / Self Care | Payer: BC Managed Care – PPO | Attending: Cardiology | Admitting: Cardiology

## 2022-01-28 DIAGNOSIS — E7800 Pure hypercholesterolemia, unspecified: Secondary | ICD-10-CM | POA: Insufficient documentation

## 2022-01-28 LAB — LIPID PANEL
Cholesterol: 279 mg/dL — ABNORMAL HIGH (ref 0–200)
HDL: 50 mg/dL (ref >40)
LDL Cholesterol: 202 mg/dL — ABNORMAL HIGH (ref 0–99)
Total CHOL/HDL Ratio: 5.6 RATIO
Triglycerides: 135 mg/dL (ref <150)
VLDL: 27 mg/dL (ref 0–40)

## 2022-01-30 ENCOUNTER — Telehealth (HOSPITAL_COMMUNITY): Payer: Self-pay | Admitting: *Deleted

## 2022-01-30 NOTE — Telephone Encounter (Signed)
Reaching out to patient to offer assistance regarding upcoming cardiac imaging study; pt verbalizes understanding of appt date/time, parking situation and where to check in, pre-test NPO status and medications ordered, and verified current allergies; name and call back number provided for further questions should they arise  Kee Drudge RN Navigator Cardiac Imaging [facility] [phone] office [phone] cell  Patient to take 100mg metoprolol tartrate and 10mg ivabradine two hours prior to her cardiac CT scan. 

## 2022-01-31 ENCOUNTER — Other Ambulatory Visit: Payer: Self-pay

## 2022-01-31 ENCOUNTER — Ambulatory Visit
Admission: RE | Admit: 2022-01-31 | Discharge: 2022-01-31 | Disposition: A | Discharge status: Home / Self Care | Payer: BC Managed Care – PPO | Source: Ambulatory Visit | Attending: Cardiology | Admitting: Cardiology

## 2022-01-31 DIAGNOSIS — R072 Precordial pain: Secondary | ICD-10-CM | POA: Insufficient documentation

## 2022-01-31 MED ORDER — IOHEXOL 350 MG/ML SOLN
100.0000 mL | Freq: Once | INTRAVENOUS | Status: AC | PRN
  Administered 2022-01-31: 100 mL via INTRAVENOUS

## 2022-01-31 MED ORDER — NITROGLYCERIN 0.4 MG SL SUBL
0.8000 mg | SUBLINGUAL_TABLET | Freq: Once | SUBLINGUAL | Status: AC
  Administered 2022-01-31: 0.8 mg via SUBLINGUAL

## 2022-01-31 NOTE — Progress Notes (Signed)
Patient tolerated CT well. Drank water after. Vital signs stable encourage to drink water throughout day.Reasons explained and verbalized understanding. Ambulated steady gait.  

## 2022-02-05 ENCOUNTER — Telehealth: Payer: Self-pay

## 2022-02-05 NOTE — Telephone Encounter (Signed)
Called patient using interpreter [phone] and gave her the following two result notes: ? ?Normal coronary CTA, no evidence of CAD.  ? ?2.  Cholesterol abnormal.  Continue Crestor 20 mg daily as prescribed.  Obtain coronary CTA as scheduled.  ? ?Patient informed me that she has been having some swelling in the left leg at the end of the day and some shortness of breath. Patient had an echo scheduled for 02/12/22 and we rescheduled it for tomorrow at 4 PM.  ? ?Patient was grateful for the call.  ? ? ?

## 2022-02-06 ENCOUNTER — Ambulatory Visit (INDEPENDENT_AMBULATORY_CARE_PROVIDER_SITE_OTHER): Payer: BC Managed Care – PPO

## 2022-02-06 DIAGNOSIS — R072 Precordial pain: Secondary | ICD-10-CM | POA: Diagnosis not present

## 2022-02-08 LAB — ECHOCARDIOGRAM COMPLETE
AR max vel: 2.66 cm2
AV Area VTI: 2.4 cm2
AV Area mean vel: 2.5 cm2
AV Mean grad: 4 mmHg
AV Peak grad: 8.3 mmHg
Ao pk vel: 1.44 m/s
Area-P 1/2: 3.3 cm2
Calc EF: 56.3 %
S' Lateral: 2.8 cm
Single Plane A2C EF: 57.2 %
Single Plane A4C EF: 56.7 %

## 2022-02-12 ENCOUNTER — Other Ambulatory Visit: Payer: BC Managed Care – PPO

## 2022-03-15 ENCOUNTER — Ambulatory Visit: Payer: BC Managed Care – PPO | Admitting: Cardiology

## 2022-04-18 ENCOUNTER — Ambulatory Visit
Admission: EM | Admit: 2022-04-18 | Discharge: 2022-04-18 | Disposition: A | Discharge status: Home / Self Care | Payer: BC Managed Care – PPO | Attending: Emergency Medicine | Admitting: Emergency Medicine

## 2022-04-18 DIAGNOSIS — S56912A Strain of unspecified muscles, fascia and tendons at forearm level, left arm, initial encounter: Secondary | ICD-10-CM

## 2022-04-18 DIAGNOSIS — H01004 Unspecified blepharitis left upper eyelid: Secondary | ICD-10-CM | POA: Diagnosis not present

## 2022-04-18 MED ORDER — IBUPROFEN 600 MG PO TABS: 600.0000 mg | ORAL_TABLET | Freq: Four times a day (QID) | ORAL | 0 refills | Status: AC | PRN

## 2022-04-18 MED ORDER — BACLOFEN 10 MG PO TABS: 10.0000 mg | ORAL_TABLET | Freq: Three times a day (TID) | ORAL | 0 refills | Status: DC

## 2022-04-18 MED ORDER — ERYTHROMYCIN 5 MG/GM OP OINT: TOPICAL_OINTMENT | OPHTHALMIC | 0 refills | Status: DC

## 2022-04-18 NOTE — ED Provider Notes (Signed)
[facility]    CSN: [id number] Arrival date & time: 04/18/22  0865      History   Chief Complaint Chief Complaint  Patient presents with   Eye Pain    left   Hand Pain    left    HPI Caitlin Cross is a 56 y.o. female.   HPI  68 old female here for evaluation of multiple complaints.  Patient's first complaint is that she has been experiencing pain in her left eye since approximately 430 this morning.  She decays that the pain is actually above her eyebrow on her eye ridge and she indicates that she has some swelling to the upper eyelid of her left eye.  She denies any changes in vision.  She also denies any drainage from the eye or foreign body sensation.  She works at [employer] and denies chemical exposure.  Patient second complaint is that she is experiencing pain in her left forearm that she describes as tension.  She denies any numbness or tingling in her fingers or weakness in her grip.  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  She is also endorsing some tension in her left leg but she denies numbness, tingling, or weakness.  She denies headache.  Per the triage note she had some nausea and vomiting but she denied this when I asked her.  She states that 1 day earlier in the week she had a short episode of dizziness while she was eating breakfast but is not had that returned and she is not dizzy at present.  Patient does have a history of high blood pressure and she is taking amlodipine for that.  Her blood pressure is currently 130/102.  Past Medical History:  Diagnosis Date   Anemia    Hypercholesterolemia     Patient Active Problem List   Diagnosis Date Noted   Primary osteoarthritis of left knee 04/30/2021    Past Surgical History:  Procedure Laterality Date   CESAREAN SECTION     x 2    OB History     Gravida  4   Para  4   Term      Preterm      AB      Living  4       SAB      IAB      Ectopic      Multiple  1   Live Births               Home Medications    Prior to Admission medications   Medication Sig Start Date End Date Taking? Authorizing Provider  amLODipine (NORVASC) 5 MG tablet Take 1 tablet (5 mg total) by mouth daily. 01/07/22 01/07/23 Yes Gilles Chiquito, MD  Ascorbic Acid (VITAMIN C) 1000 MG tablet Take 2,000 mg by mouth daily.   Yes [provider]  baclofen (LIORESAL) 10 MG tablet Take 1 tablet (10 mg total) by mouth 3 (three) times daily. 04/18/22  Yes Becky Augusta, NP  erythromycin ophthalmic ointment Place a 1/2 inch ribbon of ointment into the lower eyelid. 04/18/22  Yes Becky Augusta, NP  ibuprofen (ADVIL) 600 MG tablet Take 1 tablet (600 mg total) by mouth every 6 (six) hours as needed. 04/18/22  Yes Becky Augusta, NP  Multiple Vitamins-Minerals (WOMENS MULTIVITAMIN PO) Take 1 tablet by mouth daily.   Yes [provider]  Omega-3 Fatty Acids (FISH OIL) 1000 MG CAPS Take 1 capsule by mouth daily.   Yes [provider]  rosuvastatin (CRESTOR) 20 MG tablet Take 1 tablet (20 mg total) by mouth daily. 01/24/22  Yes Debbe Odea, MD    Family History Family History  Problem Relation Age of Onset   Diabetes Mother    Heart disease Father    Stroke Father    Diabetes Sister    Diabetes Brother    Diabetes Maternal Grandmother     Social History Social History   Tobacco Use   Smoking status: Former   Smokeless tobacco: Never  Building services engineer Use: Never used  Substance Use Topics   Alcohol use: Not Currently   Drug use: Never     Allergies   Patient has no known allergies.   Review of Systems Review of Systems  Eyes:  Negative for photophobia, pain, discharge, redness, itching and visual disturbance.  Musculoskeletal:  Positive for myalgias.  Skin:  Negative for rash.  Neurological:  Positive for dizziness. Negative for weakness, numbness and headaches.  Hematological:  Negative.   Psychiatric/Behavioral: Negative.       Physical Exam Triage Vital Signs ED Triage Vitals  Enc Vitals Group     BP 04/18/22 0944 (!) 130/102     Pulse Rate 04/18/22 0944 73     Resp 04/18/22 0944 20     Temp 04/18/22 0944 98.3 F (36.8 C)     Temp Source 04/18/22 0944 Oral     SpO2 04/18/22 0944 98 %     Weight 04/18/22 0942 200 lb (90.7 kg)     Height 04/18/22 0942 5\' 5"  (1.651 m)     Head Circumference --      Peak Flow --      Pain Score 04/18/22 0942 7     Pain Loc --      Pain Edu? --      Excl. in GC? --    No data found.  Updated Vital Signs BP (!) 130/102 (BP Location: Left Arm)   Pulse 73   Temp 98.3 F (36.8 C) (Oral)   Resp 20   Ht 5\' 5"  (1.651 m)   Wt 200 lb (90.7 kg)   SpO2 98%   BMI 33.28 kg/m   Visual Acuity Right Eye Distance: 20/30 corrected Left Eye Distance: 20/40 corrected Bilateral Distance: 20/20 corrected  Right Eye Near:   Left Eye Near:    Bilateral Near:     Physical Exam Vitals and nursing note reviewed.  Constitutional:      Appearance: Normal appearance. She is not ill-appearing.  HENT:     Head: Normocephalic and atraumatic.  Eyes:     General: No scleral icterus.       Right eye: No discharge.        Left eye: No discharge.     Extraocular Movements: Extraocular movements intact.     Conjunctiva/sclera: Conjunctivae normal.     Pupils: Pupils are equal, round, and reactive to light.     Comments: Mild swelling to the left upper eyelid and possible erythema.  No discharge noted.  Positive red light reflex.  Pupils equal round reactive.  Musculoskeletal:        General: Tenderness present. No swelling, deformity or signs of injury. Normal range of motion.  Skin:    General: Skin is warm and dry.     Capillary Refill: Capillary refill takes less than 2 seconds.  Findings: No bruising or erythema.  Neurological:     General: No focal deficit present.     Mental Status: She is alert and oriented to person,  place, and time.     Sensory: No sensory deficit.     Motor: No weakness.  Psychiatric:        Mood and Affect: Mood normal.        Behavior: Behavior normal.        Thought Content: Thought content normal.        Judgment: Judgment normal.      UC Treatments / Results  Labs (all labs ordered are listed, but only abnormal results are displayed) Labs Reviewed - No data to display  EKG   Radiology No results found.  Procedures Procedures (including critical care time)  Medications Ordered in UC Medications - No data to display  Initial Impression / Assessment and Plan / UC Course  I have reviewed the triage vital signs and the nursing notes.  Pertinent labs & imaging results that were available during my care of the patient were reviewed by me and considered in my medical decision making (see chart for details).  Toxic appearing 56 year old female here for evaluation of multiple complaints as outlined in HPI above.  Ophthalmologic exam reveals pupils are equal round reactive and EOMs that is intact in both eyes.  Bulbar and labral conjunctiva bilaterally are within normal limits.  There is no papilledema or abnormalities to the optic disc.  Patient does have some mild tenderness and slight edema to the left upper eyelid.  Patient might have early developing blepharitis and I will treat her with erythromycin ointment and eyelid hygiene.  Also warm compresses.  Patient has been applying ice which provides some improvement.  Patient second complaint is tension in her left forearm.  She denies any numbness or tingling or weakness in her grip.  She has full sensation all of her fingers and no pain with palpation of the bones of the phalanges, hand, or wrist.  No pain with passive range of motion of the wrist either.  Her grip is 5/5.  She does have some mild pain when palpating the flexor muscle group of the medial bulbar forearm.  There is no erythema, edema, or ecchymosis noted.  The  muscle tension may very well be secondary to her job that she performs at [employer] on the [employer].  I will treat her with anti-inflammatories and nonsedating muscle lectures as well as rest and moist heat to see if this improves her symptoms.  As I was discharging the patient the patient also complained of some tension in her left lower leg but she is not having any abnormal movements or difficulty.  I suggest that she see her Statistician at her work to see if there is something she is doing that is putting more stress on the left side of her body than the right.  Her exam is normal.  She also endorses some mild dizziness at 1 point eating breakfast earlier in the week but that resolved and has not returned.  She is not dizzy at this point.  I will give the patient a work note to be out of work today and tomorrow so that she can rest as I think some of this may be stress related secondary to her job.   Final Clinical Impressions(s) / UC Diagnoses   Final diagnoses:  Forearm strain, left, initial encounter  Blepharitis of left  upper eyelid, unspecified type     Discharge Instructions      Perform eyelid hygiene twice daily with baby shampoo worked into a Psychiatric nurserich lather. Scrub your eyelids 10 times and rinse thoroughly.  Apply a thin, 1 inch ribbon of erythromycin to your inner eyelid margin at bedtime daily for 2 weeks.  Take the ibuprofen, 600 mg every 6 hours with food, on a schedule for the next 48 hours and then as needed.  Take the baclofen, 10 mg every 8 hours, on a schedule for the next 48 hours and then as needed.  Apply moist heat to your arm and leg for 30 minutes at a time 2-3 times a day to improve blood flow to the area and help remove the lactic acid causing the spasm.  Rest as much as possible.  Return for reevaluation for any new or worsening symptoms.        ED Prescriptions     Medication Sig Dispense Auth. Provider   ibuprofen (ADVIL)  600 MG tablet Take 1 tablet (600 mg total) by mouth every 6 (six) hours as needed. 30 tablet Becky Augustayan, Syble Picco, NP   baclofen (LIORESAL) 10 MG tablet Take 1 tablet (10 mg total) by mouth 3 (three) times daily. 30 each Becky Augustayan, Frona Yost, NP   erythromycin ophthalmic ointment Place a 1/2 inch ribbon of ointment into the lower eyelid. 3.5 g Becky Augustayan, Bena Kobel, NP      PDMP not reviewed this encounter.   Becky Augustayan, Journey Castonguay, NP 04/18/22 1026

## 2022-04-18 NOTE — ED Triage Notes (Signed)
Patient presents to UC for pain in her left eye -- started at 4:30 this AM.  Patient c.o left hand pain and N/V

## 2022-04-18 NOTE — Discharge Instructions (Addendum)
Perform eyelid hygiene twice daily with baby shampoo worked into a Scientist, physiological. Scrub your eyelids 10 times and rinse thoroughly.  Apply a thin, 1 inch ribbon of erythromycin to your inner eyelid margin at bedtime daily for 2 weeks.  Take the ibuprofen, 600 mg every 6 hours with food, on a schedule for the next 48 hours and then as needed.  Take the baclofen, 10 mg every 8 hours, on a schedule for the next 48 hours and then as needed.  Apply moist heat to your arm and leg for 30 minutes at a time 2-3 times a day to improve blood flow to the area and help remove the lactic acid causing the spasm.  Rest as much as possible.  Return for reevaluation for any new or worsening symptoms.

## 2022-04-26 ENCOUNTER — Ambulatory Visit (INDEPENDENT_AMBULATORY_CARE_PROVIDER_SITE_OTHER): Payer: BC Managed Care – PPO | Admitting: Cardiology

## 2022-04-26 ENCOUNTER — Encounter: Payer: Self-pay | Admitting: Cardiology

## 2022-04-26 VITALS — BP 126/76 | HR 76 | Ht 65.0 in | Wt 204.4 lb

## 2022-04-26 DIAGNOSIS — R072 Precordial pain: Secondary | ICD-10-CM

## 2022-04-26 DIAGNOSIS — I10 Essential (primary) hypertension: Secondary | ICD-10-CM

## 2022-04-26 DIAGNOSIS — E7800 Pure hypercholesterolemia, unspecified: Secondary | ICD-10-CM

## 2022-04-26 MED ORDER — LOSARTAN POTASSIUM 50 MG PO TABS: 50.0000 mg | ORAL_TABLET | Freq: Every day | ORAL | 3 refills | Status: AC

## 2022-04-26 NOTE — Patient Instructions (Signed)
Medication Instructions:  Your physician has recommended you make the following change in your medication:   STOP taking amlodipine  START taking losartan (Cozaar) 50 mg daily   *If you need a refill on your cardiac medications before your next appointment, please call your pharmacy*   Lab Work: Your physician recommends that you return for a FASTING lipid profile in 2 months.  - You will need to be fasting. Please do not have anything to eat or drink after midnight the morning you have the lab work. You may only have water or black coffee with no cream or sugar.   - Please go to the [facility]. You will check in at the front desk to the right as you walk into the atrium. Valet Parking is offered if needed. - No appointment needed. You may go any day between 7 am and 6 pm.   If you have labs (blood work) drawn today and your tests are completely normal, you will receive your results only by: MyChart Message (if you have MyChart) OR A paper copy in the mail If you have any lab test that is abnormal or we need to change your treatment, we will call you to review the results.   Testing/Procedures: None ordered   Follow-Up: At [facility], you and your health needs are our priority.  As part of our continuing mission to provide you with exceptional heart care, we have created designated Provider Care Teams.  These Care Teams include your primary Cardiologist (physician) and Advanced Practice Providers (APPs -  Physician Assistants and Nurse Practitioners) who all work together to provide you with the care you need, when you need it.  We recommend signing up for the patient portal called "MyChart".  Sign up information is provided on this After Visit Summary.  MyChart is used to connect with patients for Virtual Visits (Telemedicine).  Patients are able to view lab/test results, encounter notes, upcoming appointments, etc.  Non-urgent messages can be sent to your provider as well.    To learn more about what you can do with MyChart, go to [email or web address].    Your next appointment:   2 month(s)  The format for your next appointment:   In Person  Provider:   You may see Debbe Odea, MD or one of the following Advanced Practice Providers on your designated Care Team:   Nicolasa Ducking, NP Eula Listen, PA-C Cadence Fransico Michael, [place]   Other Instructions N/A  Important Information About Sugar

## 2022-04-26 NOTE — Progress Notes (Signed)
Cardiology Office Note:    Date:  04/26/2022   ID:  Caitlin Cross, DOB 07/31/1966, MRN [id number]  PCP:  Duanne Limerick, MD   [facility] Providers Cardiologist:  None     Referring MD: Duanne Limerick, MD   No chief complaint on file.   History of Present Illness:    Caitlin Cross is a 56 y.o. female with a hx of hypertension, hyperlipidemia who presents for follow-up.  Previously seen due to chest pain.  Echocardiogram and coronary CTA was ordered to evaluate cardiac abnormality.  Started on Crestor due to elevated cholesterol levels.  History of heart attacks in her father.  Chest pain is better, states having anxiety occasionally, plans to follow-up with PCP regarding this.  Complains of leg swelling since starting amlodipine.  Past Medical History:  Diagnosis Date   Anemia    Hypercholesterolemia     Past Surgical History:  Procedure Laterality Date   CESAREAN SECTION     x 2    Current Medications: Current Meds  Medication Sig   Ascorbic Acid (VITAMIN C) 1000 MG tablet Take 2,000 mg by mouth daily.   baclofen (LIORESAL) 10 MG tablet Take 1 tablet (10 mg total) by mouth 3 (three) times daily.   erythromycin ophthalmic ointment Place a 1/2 inch ribbon of ointment into the lower eyelid.   ibuprofen (ADVIL) 600 MG tablet Take 1 tablet (600 mg total) by mouth every 6 (six) hours as needed.   losartan (COZAAR) 50 MG tablet Take 1 tablet (50 mg total) by mouth daily.   Multiple Vitamins-Minerals (WOMENS MULTIVITAMIN PO) Take 1 tablet by mouth daily.   Omega-3 Fatty Acids (FISH OIL) 1000 MG CAPS Take 1 capsule by mouth daily.   rosuvastatin (CRESTOR) 20 MG tablet Take 1 tablet (20 mg total) by mouth daily.   [DISCONTINUED] amLODipine (NORVASC) 5 MG tablet Take 1 tablet (5 mg total) by mouth daily.     Allergies:   Patient has no known allergies.   Social History   Socioeconomic History   Marital status: Married    Spouse name: Evelena Leyden   Number of  children: Not on file   Years of education: Not on file   Highest education level: Not on file  Occupational History   Not on file  Tobacco Use   Smoking status: Former   Smokeless tobacco: Never  Vaping Use   Vaping Use: Never used  Substance and Sexual Activity   Alcohol use: Not Currently   Drug use: Never   Sexual activity: Yes    Partners: Male    Birth control/protection: Post-menopausal  Other Topics Concern   Not on file  Social History Narrative   Not on file   Social Determinants of Health   Financial Resource Strain: Not on file  Food Insecurity: Not on file  Transportation Needs: Not on file  Physical Activity: Not on file  Stress: Not on file  Social Connections: Not on file     Family History: The patient's family history includes Diabetes in her brother, maternal grandmother, mother, and sister; Heart disease in her father; Stroke in her father.  ROS:   Please see the history of present illness.     All other systems reviewed and are negative.  EKGs/Labs/Other Studies Reviewed:    The following studies were reviewed today:   EKG:  EKG not  ordered today.   Recent Labs: 01/07/2022: BUN 16; Creatinine, Ser 0.63; Hemoglobin 13.7; Platelets 276; Potassium 3.7; Sodium  139  Recent Lipid Panel    Component Value Date/Time   CHOL 279 (H) 01/28/2022 0715   CHOL 323 (H) 04/18/2021 1137   TRIG 135 01/28/2022 0715   HDL 50 01/28/2022 0715   HDL 55 04/18/2021 1137   CHOLHDL 5.6 01/28/2022 0715   VLDL 27 01/28/2022 0715   LDLCALC 202 (H) 01/28/2022 0715   LDLCALC 248 (H) 04/18/2021 1137     Risk Assessment/Calculations:          Physical Exam:    VS:  BP 126/76   Pulse 76   Ht 5\' 5"  (1.651 m)   Wt 204 lb 6.4 oz (92.7 kg)   SpO2 95%   BMI 34.01 kg/m     Wt Readings from Last 3 Encounters:  04/26/22 204 lb 6.4 oz (92.7 kg)  04/18/22 200 lb (90.7 kg)  01/24/22 206 lb (93.4 kg)     GEN:  Well nourished, well developed in no acute  distress HEENT: Normal NECK: No JVD; No carotid bruits LYMPHATICS: No lymphadenopathy CARDIAC: RRR, no murmurs, rubs, gallops RESPIRATORY:  Clear to auscultation without rales, wheezing or rhonchi  ABDOMEN: Soft, non-tender, non-distended MUSCULOSKELETAL:  No edema; No deformity  SKIN: Warm and dry NEUROLOGIC:  Alert and oriented x 3 PSYCHIATRIC:  Normal affect   ASSESSMENT:    1. Precordial pain   2. Primary hypertension   3. Pure hypercholesterolemia     PLAN:    In order of problems listed above:  Chest pain, risk factors hypertension, hyperlipidemia.  Coronary CTA was normal, no evidence of CAD.  Echo showed normal systolic and diastolic function, EF 55%.  Patient made aware of results, reassured. Hypertension, BP controlled, stop Norvasc, start losartan 50 mg daily. Hyperlipidemia, continue Crestor 20 mg daily.  Obtain lipid panel in 2 months.  Follow-up in 3 months.      Spanish interpreter used for this visit.   Medication Adjustments/Labs and Tests Ordered: Current medicines are reviewed at length with the patient today.  Concerns regarding medicines are outlined above.  Orders Placed This Encounter  Procedures   Lipid Profile   Meds ordered this encounter  Medications   losartan (COZAAR) 50 MG tablet    Sig: Take 1 tablet (50 mg total) by mouth daily.    Dispense:  90 tablet    Refill:  3    Patient Instructions  Medication Instructions:  Your physician has recommended you make the following change in your medication:   STOP taking amlodipine  START taking losartan (Cozaar) 50 mg daily   *If you need a refill on your cardiac medications before your next appointment, please call your pharmacy*   Lab Work: Your physician recommends that you return for a FASTING lipid profile in 2 months.  - You will need to be fasting. Please do not have anything to eat or drink after midnight the morning you have the lab work. You may only have water or black  coffee with no cream or sugar.   - Please go to the [facility]. You will check in at the front desk to the right as you walk into the atrium. Valet Parking is offered if needed. - No appointment needed. You may go any day between 7 am and 6 pm.   If you have labs (blood work) drawn today and your tests are completely normal, you will receive your results only by: MyChart Message (if you have MyChart) OR A paper copy in the mail If you have  any lab test that is abnormal or we need to change your treatment, we will call you to review the results.   Testing/Procedures: None ordered   Follow-Up: At [facility], you and your health needs are our priority.  As part of our continuing mission to provide you with exceptional heart care, we have created designated Provider Care Teams.  These Care Teams include your primary Cardiologist (physician) and Advanced Practice Providers (APPs -  Physician Assistants and Nurse Practitioners) who all work together to provide you with the care you need, when you need it.  We recommend signing up for the patient portal called "MyChart".  Sign up information is provided on this After Visit Summary.  MyChart is used to connect with patients for Virtual Visits (Telemedicine).  Patients are able to view lab/test results, encounter notes, upcoming appointments, etc.  Non-urgent messages can be sent to your provider as well.   To learn more about what you can do with MyChart, go to [email or web address].    Your next appointment:   2 month(s)  The format for your next appointment:   In Person  Provider:   You may see Kate Sable, MD or one of the following Advanced Practice Providers on your designated Care Team:   Murray Hodgkins, NP Christell Faith, PA-C Cadence Kathlen Mody, [place]   Other Instructions N/A  Important Information About Sugar         Signed, Kate Sable, MD  04/26/2022 5:06 PM    [place]

## 2022-07-05 ENCOUNTER — Ambulatory Visit: Payer: BC Managed Care – PPO | Admitting: Cardiology

## 2022-08-05 IMAGING — CR DG SINUSES COMPLETE 3+V
4 series · 5 of 5 positions shown · non-contrast
Comparison: None.

CLINICAL DATA: Cough and nasal drainage, initial encounter

EXAM:
PARANASAL SINUSES - COMPLETE 3 + VIEW

[sinuses waters]
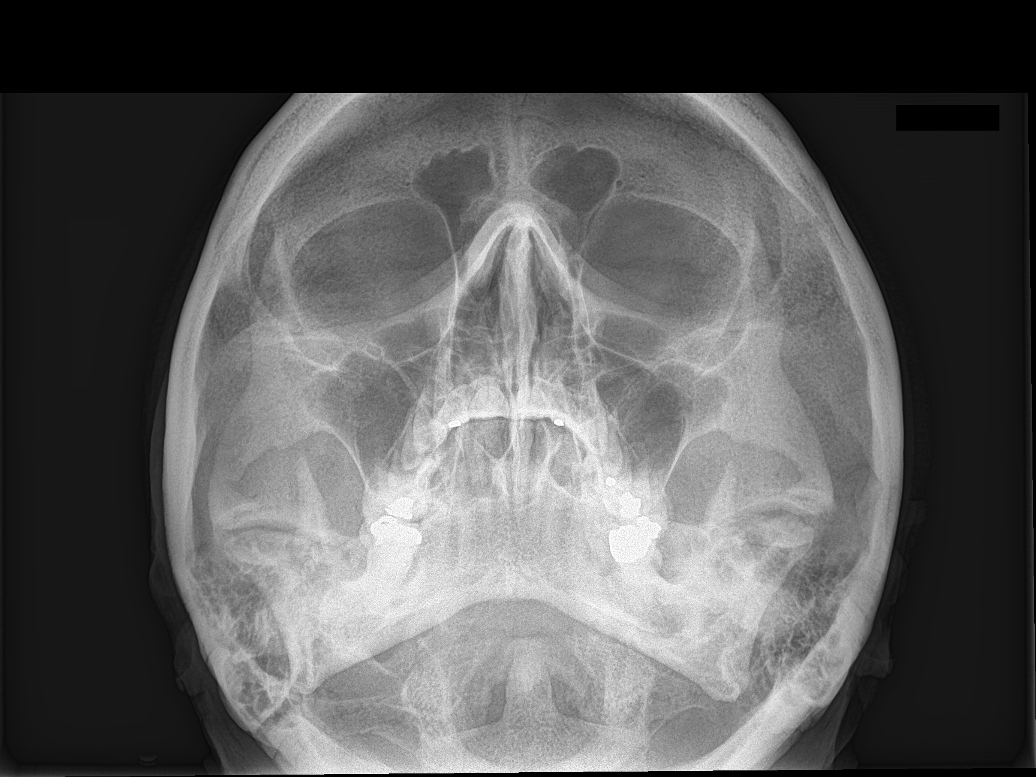

[Series 2: sinuses pa · 0.14mm/px · 2 of 2 slices shown]
[im 1/2]
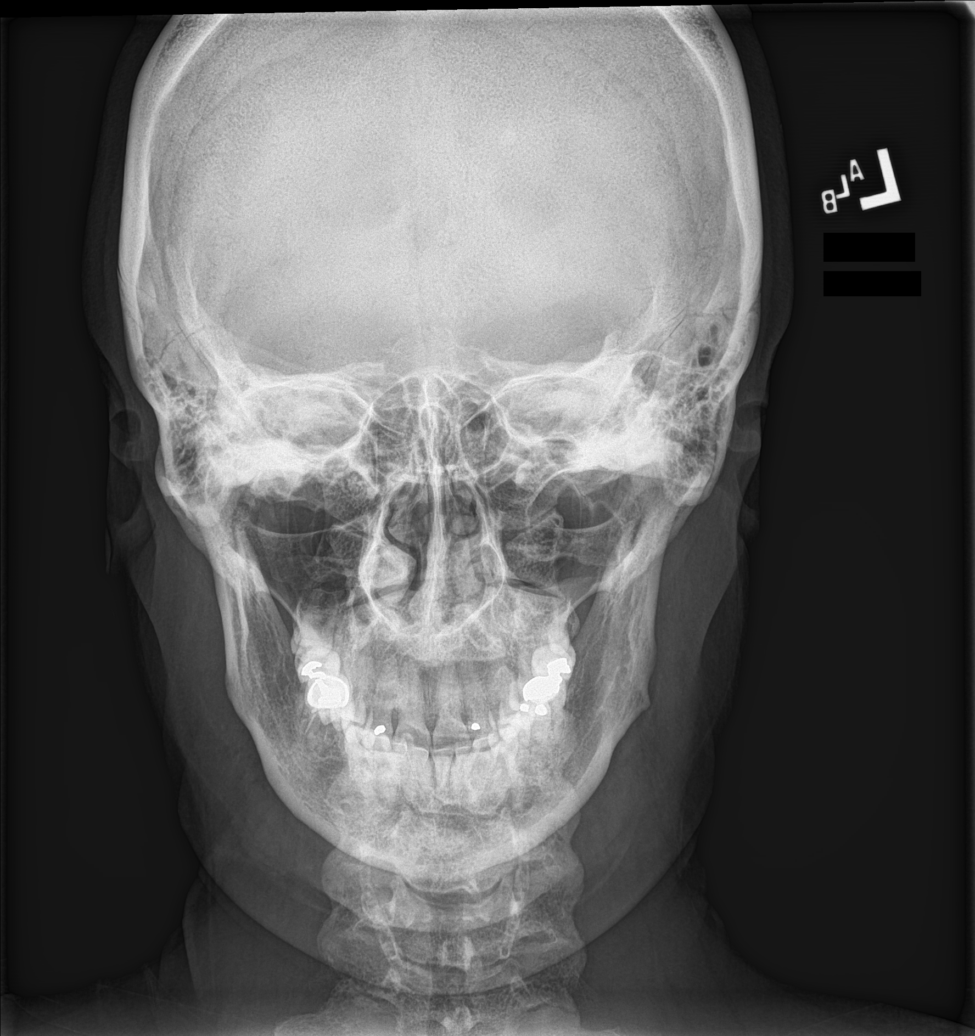
[im 2/2]
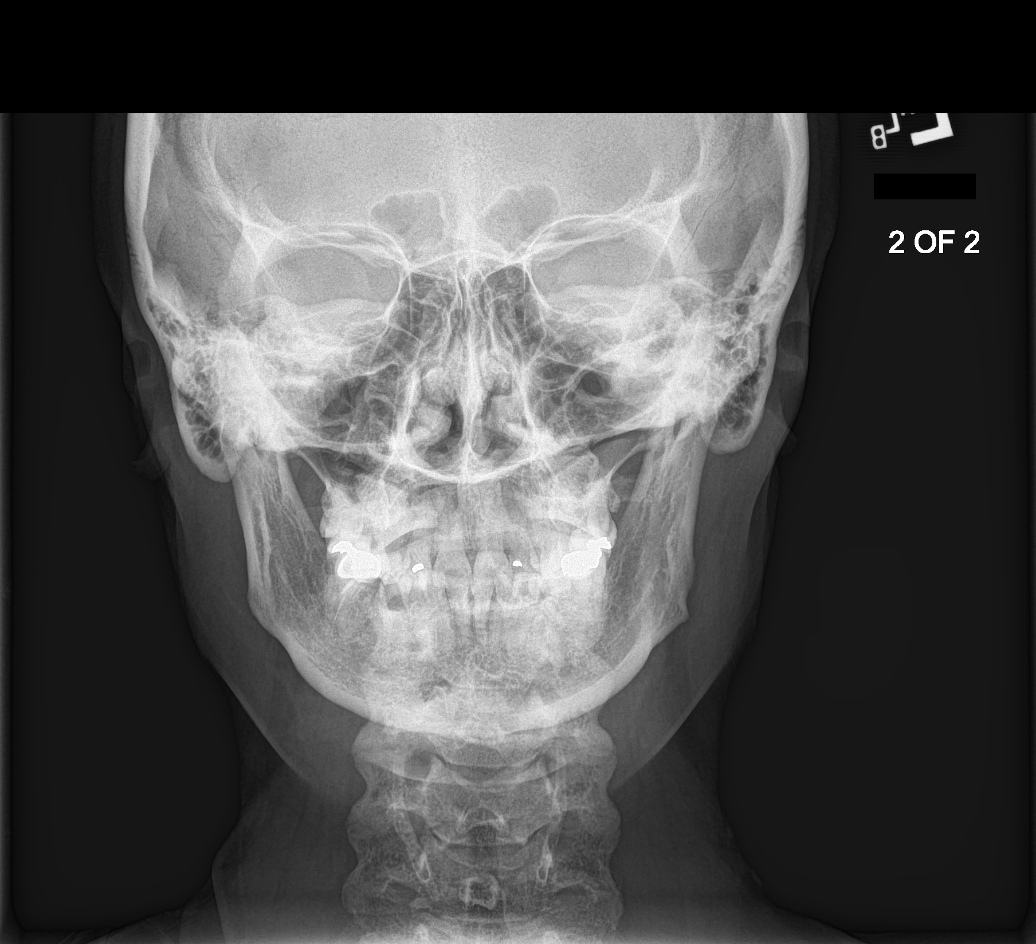

[sinuses lat]
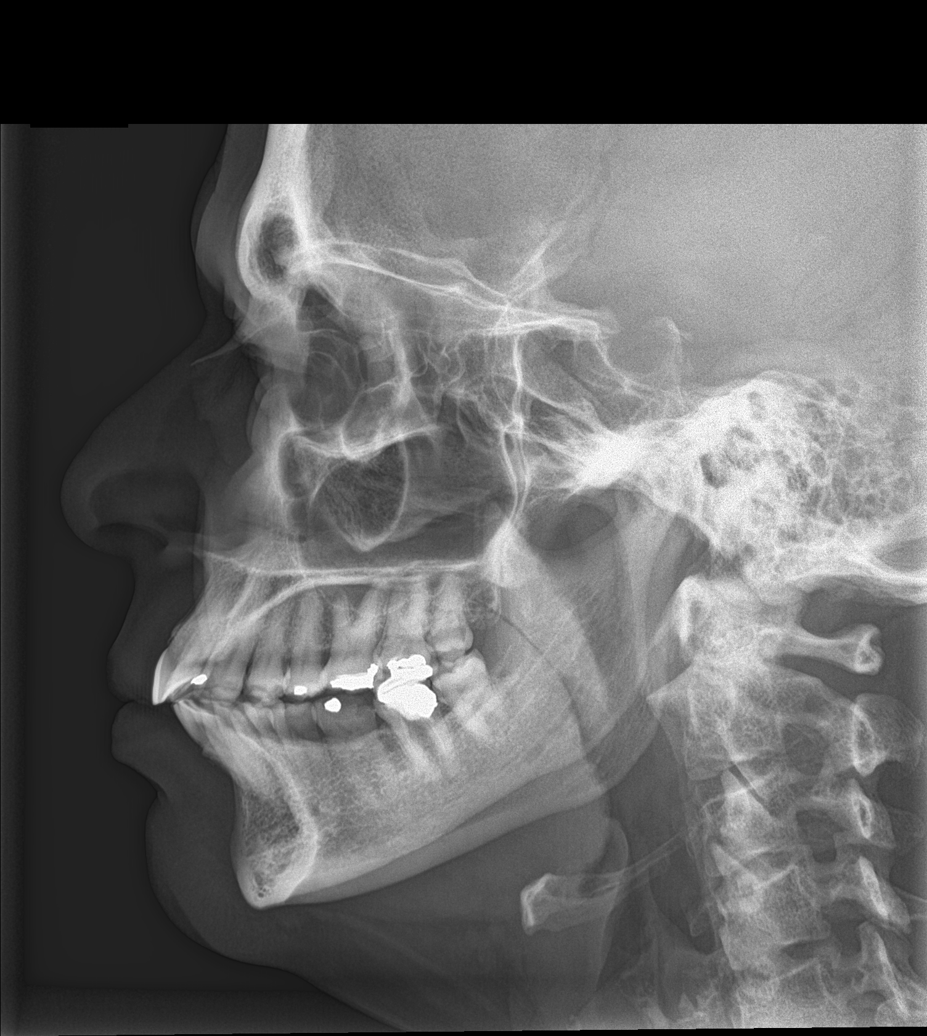

[skull smv]
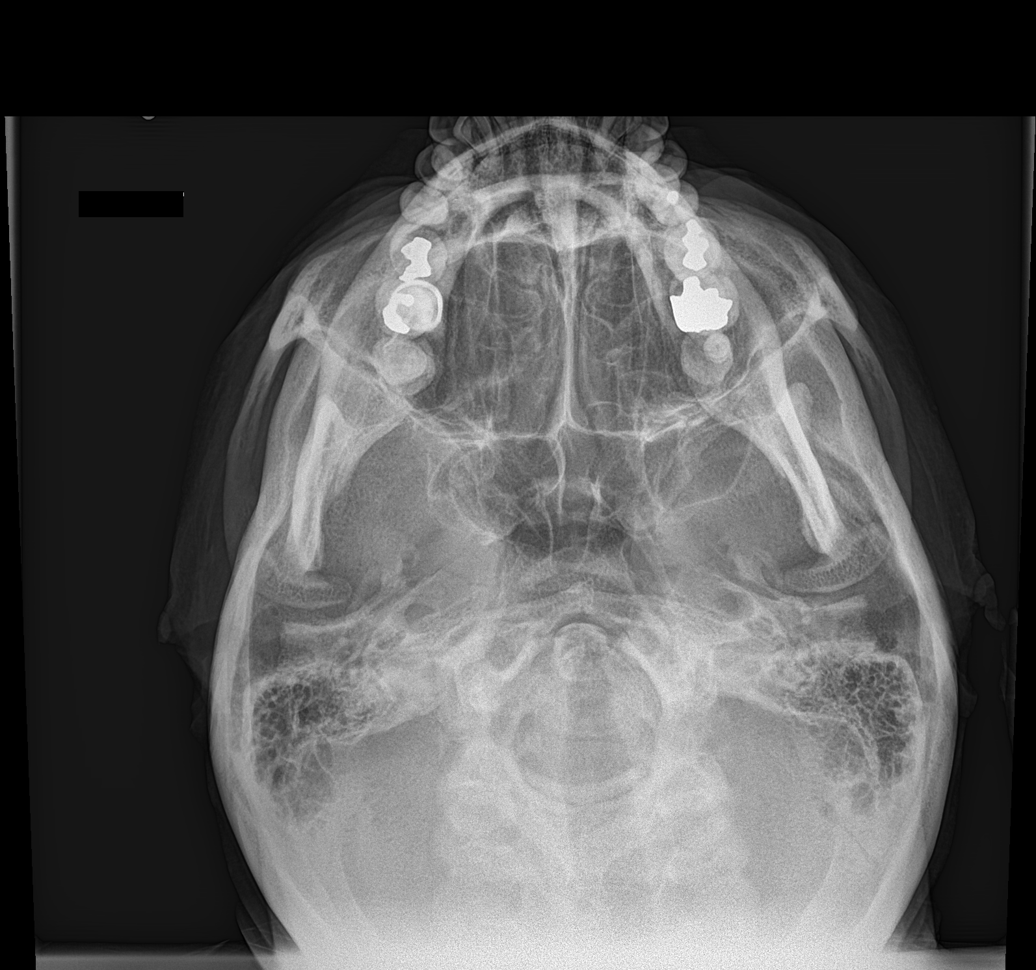

[5 of 5 positions shown; findings below may reference images not displayed]

FINDINGS: The paranasal sinus are aerated. There is no evidence of sinus
opacification air-fluid levels or mucosal thickening. No significant
bone abnormalities are seen.
IMPRESSION: No acute abnormality noted.

## 2022-08-05 IMAGING — CR DG CHEST 2V
2 series · 2 of 2 positions shown · non-contrast
Comparison: None.

CLINICAL DATA: Cough and congestion for several days

EXAM:
CHEST - 2 VIEW

[chest pa]
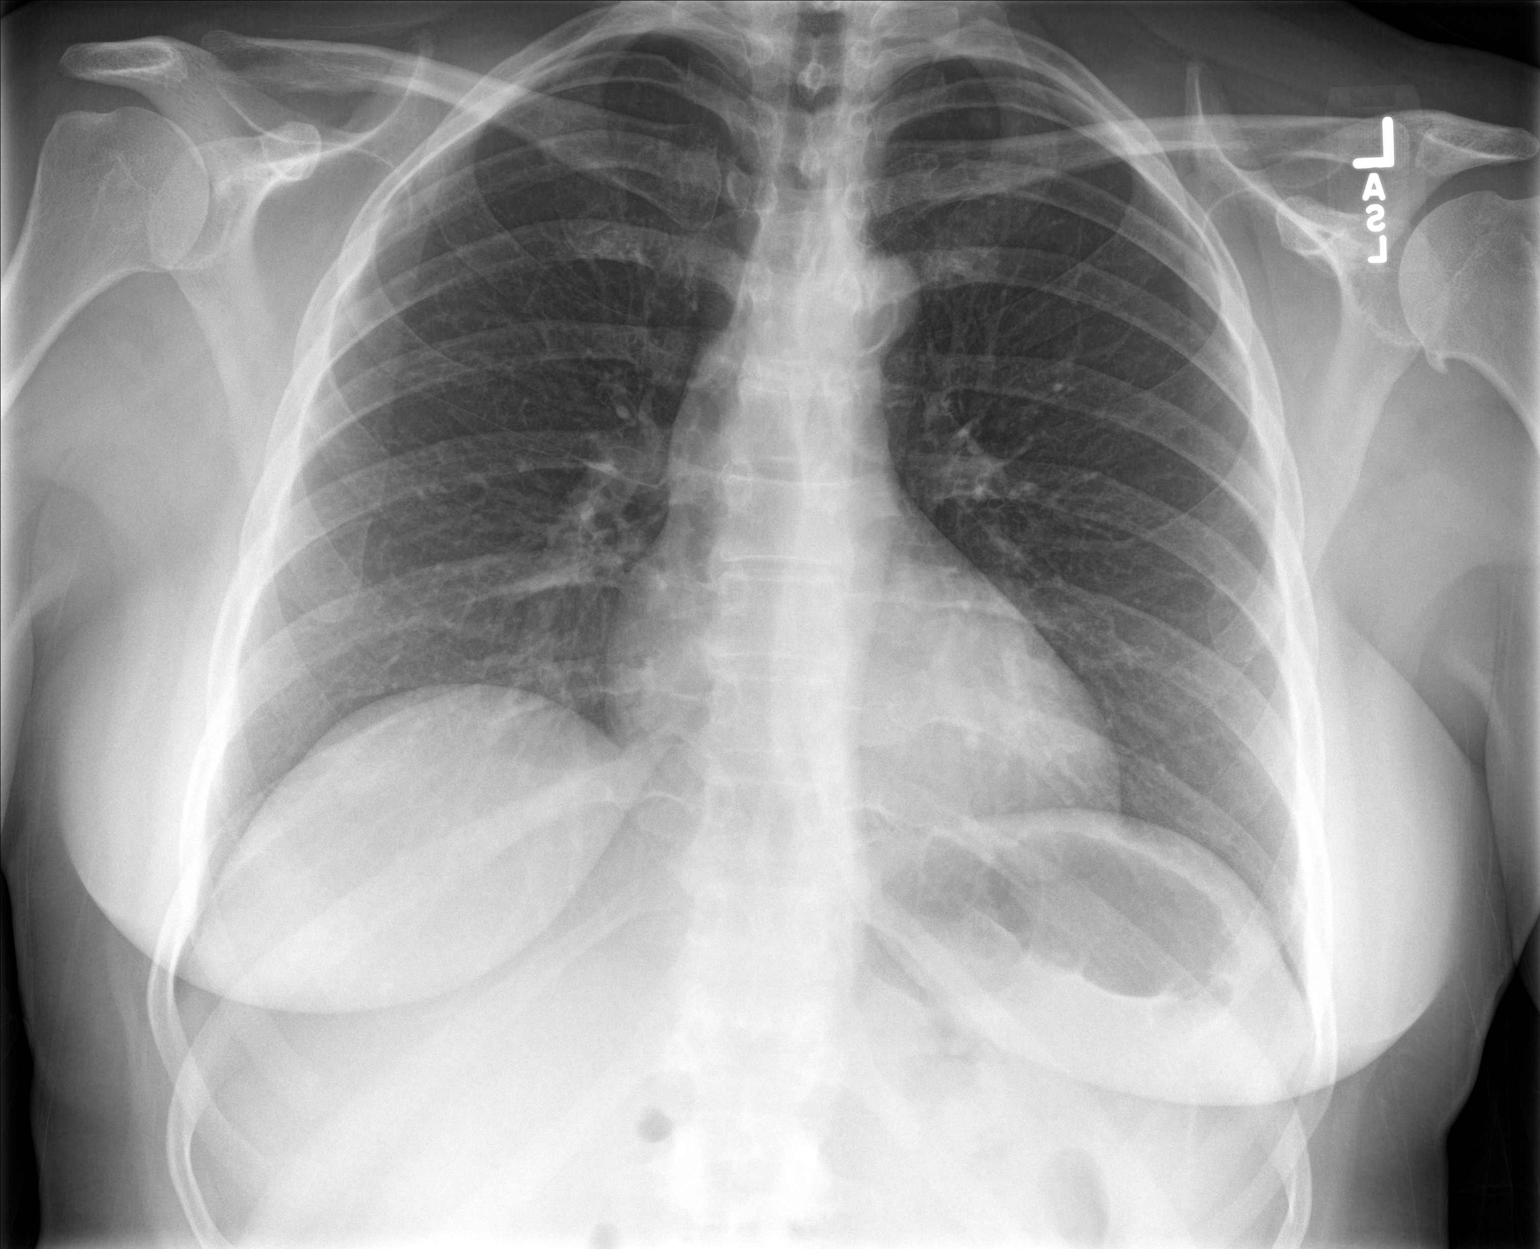

[chest lat]
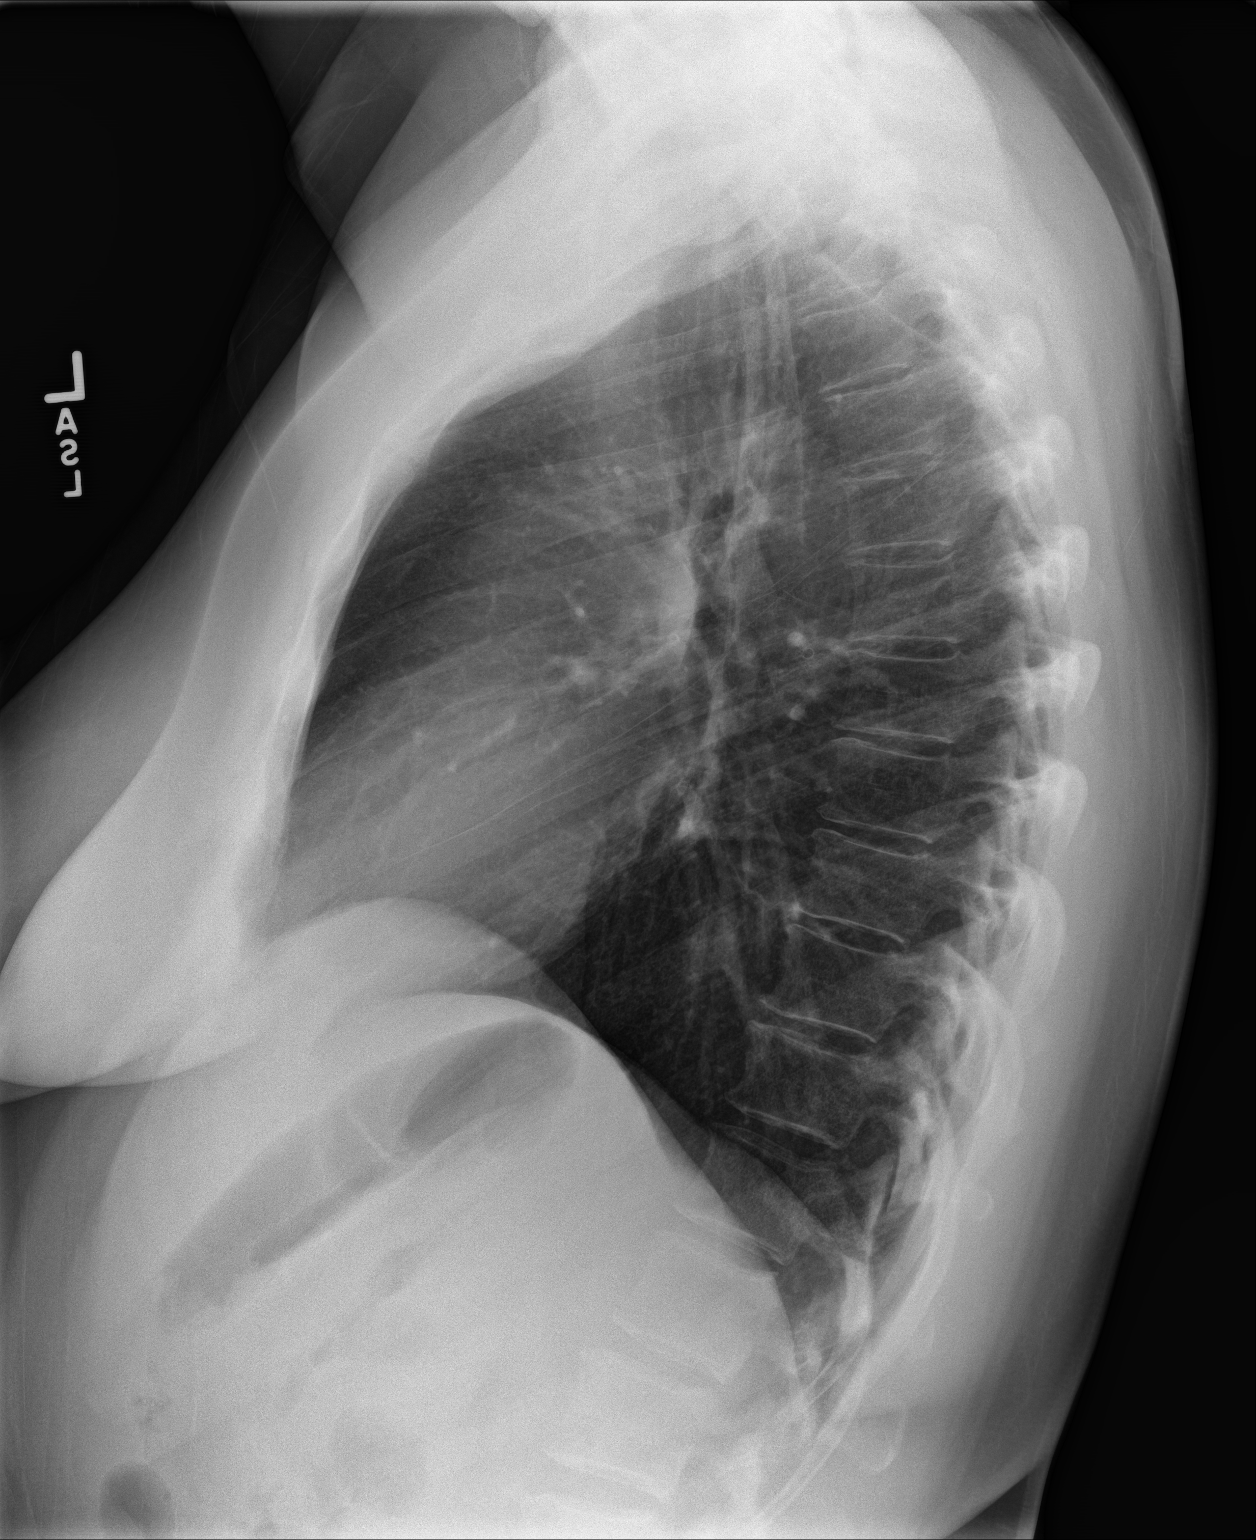

[2 of 2 positions shown; findings below may reference images not displayed]

FINDINGS: The heart size and mediastinal contours are within normal limits.
Both lungs are clear. The visualized skeletal structures are
unremarkable.
IMPRESSION: No active cardiopulmonary disease.

## 2022-09-09 ENCOUNTER — Ambulatory Visit: Payer: BC Managed Care – PPO | Admitting: Family Medicine

## 2022-09-11 ENCOUNTER — Ambulatory Visit
Admission: RE | Admit: 2022-09-11 | Discharge: 2022-09-11 | Disposition: A | Discharge status: Home / Self Care | Payer: BC Managed Care – PPO | Attending: Family Medicine | Admitting: Family Medicine

## 2022-09-11 ENCOUNTER — Encounter: Payer: Self-pay | Admitting: Family Medicine

## 2022-09-11 ENCOUNTER — Ambulatory Visit: Payer: BC Managed Care – PPO | Admitting: Family Medicine

## 2022-09-11 ENCOUNTER — Ambulatory Visit
Admission: RE | Admit: 2022-09-11 | Discharge: 2022-09-11 | Disposition: A | Discharge status: Home / Self Care | Payer: BC Managed Care – PPO | Source: Ambulatory Visit | Attending: Family Medicine | Admitting: Family Medicine

## 2022-09-11 VITALS — BP 124/74 | HR 78 | Ht 65.0 in | Wt 200.0 lb

## 2022-09-11 DIAGNOSIS — M1712 Unilateral primary osteoarthritis, left knee: Secondary | ICD-10-CM

## 2022-09-11 DIAGNOSIS — M25552 Pain in left hip: Secondary | ICD-10-CM | POA: Diagnosis not present

## 2022-09-11 DIAGNOSIS — M76822 Posterior tibial tendinitis, left leg: Secondary | ICD-10-CM

## 2022-09-11 DIAGNOSIS — M5410 Radiculopathy, site unspecified: Secondary | ICD-10-CM | POA: Insufficient documentation

## 2022-09-11 DIAGNOSIS — M545 Low back pain, unspecified: Secondary | ICD-10-CM | POA: Diagnosis not present

## 2022-09-11 DIAGNOSIS — M79605 Pain in left leg: Secondary | ICD-10-CM | POA: Diagnosis not present

## 2022-09-11 DIAGNOSIS — M4316 Spondylolisthesis, lumbar region: Secondary | ICD-10-CM | POA: Diagnosis not present

## 2022-09-11 MED ORDER — CYCLOBENZAPRINE HCL 5 MG PO TABS
5.0000 mg | ORAL_TABLET | Freq: Every day | ORAL | 0 refills | Status: AC
Start: 2022-09-11 — End: ?

## 2022-09-11 MED ORDER — GABAPENTIN 100 MG PO CAPS: 100.0000 mg | ORAL_CAPSULE | Freq: Every day | ORAL | 0 refills | Status: AC

## 2022-09-11 MED ORDER — DEXAMETHASONE 4 MG PO TABS: ORAL_TABLET | ORAL | 0 refills | Status: AC

## 2022-09-11 MED ORDER — MELOXICAM 15 MG PO TABS: 15.0000 mg | ORAL_TABLET | Freq: Every day | ORAL | 0 refills | Status: AC

## 2022-09-11 NOTE — Assessment & Plan Note (Signed)
Noted in the setting of ipsilateral left knee osteoarthritis related arthralgia and left radiculopathy.  Medications and physical therapy ordered.  See additional assessment(s) for plan details.

## 2022-09-11 NOTE — Assessment & Plan Note (Signed)
Noted over the past few months, atraumatic in onset, in the setting of ipsilateral knee osteoarthritis.  Was seen earlier this year for left buttock pain in urgent care on 12/12/2021 (chart reviewed).  States that she is having pain with ambulation, when laying down, involves numbness and tingling, no weakness, entirety of the left leg from low back, buttock, thigh, knee, to foot/ankle involved.  Examination reveals positive straight leg raise test on the left, negative on the right, positive piriformis test, positive FABER, positive FADIR.  Given her clinical history and findings, left leg radiculopathy and left groin pain to be further evaluated with dedicated x-rays of the lumbar spine and left hip.  Plan for Decadron course, this will be followed by meloxicam daily, initiate nightly cyclobenzaprine and gabapentin.  Restart of formal physical therapy and close follow-up in 4 weeks for reevaluation.  Residual focal symptoms can be addressed with corticosteroid injections as clinically guided.

## 2022-09-11 NOTE — Assessment & Plan Note (Signed)
Chronic condition with previous episode of exacerbation 04/2022 timeframe.  Successfully treated with intra-articular corticosteroid injection followed by physical therapy.  Patient did not have an opportunity to maintain home exercises and has noted gradual recurrence over the past few months in the setting of comorbid left leg radiculopathy.  Examination with focal tenderness along the medial joint line, pain with maximal flexion, equivocal medial McMurray.  Given constellation of symptoms, initial course of Decadron followed by meloxicam advised.  Formal physical therapy ordered as well as recommendation for regular home-based maintenance exercises.  Close follow-up in 4 weeks where suboptimal progress can be further addressed with intra-articular corticosteroid injection and consideration of viscosupplementation.

## 2022-09-11 NOTE — Progress Notes (Signed)
Primary Care / Sports Medicine Office Visit  Patient Information:  Patient ID: Caitlin Cross, female DOB: [birth year] Age: 56 y.o. MRN: [id number]   Caitlin Cross is a pleasant 55 y.o. female presenting with the following:  Chief Complaint  Patient presents with   Knee Pain    Left knee pain for years, has had a flare up over the last week. Down to toes    Vitals:   09/11/22 0848  BP: 124/74  Pulse: 78  SpO2: 99%   Vitals:   09/11/22 0848  Weight: 200 lb (90.7 kg)  Height: 5\' 5"  (1.651 m)   Body mass index is 33.28 kg/m.  No results found.   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  Was seen earlier this year for left buttock pain in urgent care on 12/12/2021 (chart reviewed).  States that she is having pain with ambulation, when laying down, involves numbness and tingling, no weakness, entirety of the left leg from low back, buttock, thigh, knee, to foot/ankle involved.  Examination reveals positive straight leg raise test on the left, negative on the right, positive piriformis test, positive FABER, positive FADIR.  Given her clinical history and findings, left leg radiculopathy and left groin pain to be further evaluated with dedicated x-rays of the lumbar spine and left hip.  Plan for Decadron course, this will be followed by meloxicam daily, initiate nightly cyclobenzaprine and gabapentin.  Restart of formal physical therapy and close follow-up in 4 weeks for reevaluation.  Residual focal symptoms can be addressed with corticosteroid injections as clinically guided.      Relevant Medications   dexamethasone  (DECADRON) 4 MG tablet   meloxicam (MOBIC) 15 MG tablet   cyclobenzaprine (FLEXERIL) 5 MG tablet   gabapentin (NEURONTIN) 100 MG capsule   Other Relevant Orders   DG Lumbar Spine Complete   DG Hip Unilat W OR W/O Pelvis 2-3 Views Left   Ambulatory referral to Physical Therapy     Musculoskeletal and Integument   Primary osteoarthritis of left knee    Chronic condition with previous episode of exacerbation 04/2022 timeframe.  Successfully treated with intra-articular corticosteroid injection followed by physical therapy.  Patient did not have an opportunity to maintain home exercises and has noted gradual recurrence over the past few months in the setting of comorbid left leg radiculopathy.  Examination with focal tenderness along the medial joint line, pain with maximal flexion, equivocal medial McMurray.  Given constellation of symptoms, initial course of Decadron followed by meloxicam advised.  Formal physical therapy ordered as well as recommendation for regular home-based maintenance exercises.  Close follow-up in 4 weeks where suboptimal progress can be further addressed with intra-articular corticosteroid injection and consideration of viscosupplementation.      Relevant Medications   dexamethasone (DECADRON) 4 MG tablet   meloxicam (MOBIC) 15 MG tablet   cyclobenzaprine (FLEXERIL) 5 MG tablet   Other Relevant Orders   Ambulatory referral to Physical Therapy   Posterior tibial tendinitis, left    Noted in the setting of ipsilateral left knee osteoarthritis related arthralgia and left radiculopathy.  Medications and physical therapy  ordered.  See additional assessment(s) for plan details.      Relevant Orders   Ambulatory referral to Physical Therapy     Orders & Medications Meds ordered this encounter  Medications   dexamethasone (DECADRON) 4 MG tablet    Sig: Take 2 tablets (8 mg) by mouth for one dose, then continue with 1 tablet 4 times per day afterwards for a total course  of 5 days.    Dispense:  21 tablet    Refill:  0   meloxicam (MOBIC) 15 MG tablet    Sig: Take 1 tablet (15 mg total) by mouth daily.    Dispense:  30 tablet    Refill:  0   cyclobenzaprine (FLEXERIL) 5 MG tablet    Sig: Take 1 tablet (5 mg total) by mouth at bedtime.    Dispense:  30 tablet    Refill:  0   gabapentin (NEURONTIN) 100 MG capsule    Sig: Take 1 capsule (100 mg total) by mouth at bedtime.    Dispense:  30 capsule    Refill:  0   Orders Placed This Encounter  Procedures   DG Lumbar Spine Complete   DG Hip Unilat W OR W/O Pelvis 2-3 Views Left   Ambulatory referral to Physical Therapy     Return in about 4 weeks (around 10/09/2022).     Montel Culver, MD   Primary Care Sports Medicine [place]

## 2022-09-11 NOTE — Patient Instructions (Signed)
-   Obtain x-rays today - Take steroids for full course - After steroids complete, start meloxicam once daily with food - Take cyclobenzaprine (muscle relaxer) nightly (side effect can be drowsiness) - Take gabapentin nightly (side effect can be drowsiness) - Coordinator will contact you to schedule physical therapy, attend at least once weekly, and perform daily home exercises - Return for follow-up in 4 weeks

## 2022-10-11 DIAGNOSIS — M4727 Other spondylosis with radiculopathy, lumbosacral region: Secondary | ICD-10-CM | POA: Diagnosis not present

## 2022-10-11 DIAGNOSIS — M1712 Unilateral primary osteoarthritis, left knee: Secondary | ICD-10-CM | POA: Diagnosis not present

## 2022-10-11 DIAGNOSIS — S83232A Complex tear of medial meniscus, current injury, left knee, initial encounter: Secondary | ICD-10-CM | POA: Diagnosis not present

## 2022-10-11 DIAGNOSIS — M25562 Pain in left knee: Secondary | ICD-10-CM | POA: Diagnosis not present

## 2022-10-14 ENCOUNTER — Other Ambulatory Visit: Payer: Self-pay | Admitting: Surgery

## 2022-10-14 DIAGNOSIS — S83232A Complex tear of medial meniscus, current injury, left knee, initial encounter: Secondary | ICD-10-CM

## 2022-10-14 DIAGNOSIS — M1712 Unilateral primary osteoarthritis, left knee: Secondary | ICD-10-CM

## 2022-10-15 ENCOUNTER — Ambulatory Visit: Payer: BC Managed Care – PPO | Admitting: Family Medicine

## 2022-11-19 ENCOUNTER — Emergency Department
Admission: EM | Admit: 2022-11-19 | Discharge: 2022-11-19 | Disposition: A | Discharge status: Home / Self Care | Payer: BC Managed Care – PPO | Attending: Emergency Medicine | Admitting: Emergency Medicine

## 2022-11-19 ENCOUNTER — Other Ambulatory Visit: Payer: Self-pay

## 2022-11-19 DIAGNOSIS — U071 COVID-19: Secondary | ICD-10-CM | POA: Diagnosis not present

## 2022-11-19 DIAGNOSIS — R519 Headache, unspecified: Secondary | ICD-10-CM | POA: Diagnosis not present

## 2022-11-19 LAB — RESP PANEL BY RT-PCR (RSV, FLU A&B, COVID)  RVPGX2
Influenza A by PCR: NEGATIVE
Influenza B by PCR: NEGATIVE
Resp Syncytial Virus by PCR: NEGATIVE
SARS Coronavirus 2 by RT PCR: POSITIVE — AB

## 2022-11-19 MED ORDER — ACETAMINOPHEN 500 MG PO TABS
1000.0000 mg | ORAL_TABLET | Freq: Once | ORAL | Status: DC
  Filled 2022-11-19: qty 2

## 2022-11-19 MED ORDER — NIRMATRELVIR/RITONAVIR (PAXLOVID)TABLET: 3.0000 | ORAL_TABLET | Freq: Two times a day (BID) | ORAL | 0 refills | Status: AC

## 2022-11-19 NOTE — ED Notes (Signed)
See triage note. Pt states worst of symptoms is body aches and "no energy". Pt's resp reg/unlabored, skin dry and sitting calmly in recliner; dry cough noted; pt in NAD.

## 2022-11-19 NOTE — ED Notes (Addendum)
Error: note made in wrong pt's chart.

## 2022-11-19 NOTE — ED Notes (Signed)
Pt requesting to talk to provider about FMLA paperwork. Notified provider JM about pt's request. Provider JM states will talk with pt soon.

## 2022-11-19 NOTE — ED Triage Notes (Signed)
Pt states that she just got off of a cruise and states that she started to feel bad, states that she is coughing and congested and has a bad headache, states that she took dayquil today

## 2022-11-19 NOTE — Discharge Instructions (Addendum)
Take OTC Tylenol and Motrin for headache and fevers. Take

## 2022-11-19 NOTE — ED Provider Notes (Signed)
[facility] Emergency Department Provider Note     Event Date/Time   First MD Initiated Contact with Patient 11/19/22 1433     (approximate)   History   Headache   HPI  Caitlin Cross is a 57 y.o. female presents to the ED 1 day off of a 10-day cruise, noting she started to feel malaise yesterday.  She presents with complains of cough and congestion as well as a headache.  She is taken DayQuil without significant benefit.     Physical Exam   Triage Vital Signs: ED Triage Vitals  Enc Vitals Group     BP 11/19/22 1237 (!) 137/94     Pulse Rate 11/19/22 1237 89     Resp 11/19/22 1237 16     Temp 11/19/22 1237 98.5 F (36.9 C)     Temp Source 11/19/22 1237 Oral     SpO2 11/19/22 1237 97 %     Weight 11/19/22 1238 200 lb (90.7 kg)     Height 11/19/22 1238 5\' 6"  (1.676 m)     Head Circumference --      Peak Flow --      Pain Score 11/19/22 1237 7     Pain Loc --      Pain Edu? --      Excl. in GC? --     Most recent vital signs: Vitals:   11/19/22 1237 11/19/22 1500  BP: (!) 137/94 133/83  Pulse: 89 82  Resp: 16 17  Temp: 98.5 F (36.9 C)   SpO2: 97% 94%    General Awake, no distress.  HEENT NCAT. PERRL. EOMI. No rhinorrhea. Mucous membranes are moist.  CV:  Good peripheral perfusion.  RESP:  Normal effort.  ABD:  No distention.   ED Results / Procedures / Treatments   Labs (all labs ordered are listed, but only abnormal results are displayed) Labs Reviewed  RESP PANEL BY RT-PCR (RSV, FLU A&B, COVID)  [id number] - Abnormal; Notable for the following components:      Result Value   SARS Coronavirus 2 by RT PCR POSITIVE (*)    All other components within normal limits     EKG   RADIOLOGY  No results found.   PROCEDURES:  Critical Care performed: No  Procedures   MEDICATIONS ORDERED IN ED: Medications - No data to display   IMPRESSION / MDM / ASSESSMENT AND PLAN / ED COURSE  I reviewed the triage vital signs  and the nursing notes.                              Differential diagnosis includes, but is not limited to, COVID, flu, RSV, viral URI, AOM  Patient's presentation is most consistent with acute complicated illness / injury requiring diagnostic workup.  Patient to the ED for evaluation of flulike symptoms 1 day after returning from a 10-day cruise.  Patient is evaluated for complaints, however reassuring lab workup overall.  Viral panel test did confirm COVID.  Patient's diagnosis is consistent with COVID. Patient will be discharged home with prescriptions for Paxlovid. Patient is to follow up with primary provider as needed as needed or otherwise directed. Patient is given ED precautions to return to the ED for any worsening or new symptoms.     FINAL CLINICAL IMPRESSION(S) / ED DIAGNOSES   Final diagnoses:  COVID-19     Rx / DC Orders   ED Discharge  Orders          Ordered    nirmatrelvir/ritonavir (PAXLOVID) 20 x 150 MG & 10 x 100MG  TABS  2 times daily        11/19/22 1440             Note:  This document was prepared using Dragon voice recognition software and may include unintentional dictation errors.    Melvenia Needles, PA-C 11/19/22 1526    Lucillie Garfinkel, MD 11/19/22 1816

## 2023-08-19 ENCOUNTER — Ambulatory Visit: Payer: BC Managed Care – PPO

## 2023-08-19 DIAGNOSIS — Z23 Encounter for immunization: Secondary | ICD-10-CM
